# Patient Record
Sex: Male | Born: 1955 | Race: White | Hispanic: No | Marital: Married | Smoking: Never smoker
Health system: Southern US, Community
[De-identification: ages and names within clinical notes are randomized; demographics above are authoritative.]

## PROBLEM LIST (undated history)

## (undated) DIAGNOSIS — E7800 Pure hypercholesterolemia, unspecified: Secondary | ICD-10-CM

## (undated) DIAGNOSIS — K219 Gastro-esophageal reflux disease without esophagitis: Secondary | ICD-10-CM

## (undated) DIAGNOSIS — I8390 Asymptomatic varicose veins of unspecified lower extremity: Secondary | ICD-10-CM

## (undated) DIAGNOSIS — M1990 Unspecified osteoarthritis, unspecified site: Secondary | ICD-10-CM

## (undated) HISTORY — DX: Unspecified osteoarthritis, unspecified site: M19.90

## (undated) HISTORY — PX: CARDIAC CATHETERIZATION: SHX172

## (undated) HISTORY — PX: VARICOSE VEIN SURGERY: SHX832

## (undated) HISTORY — PX: KNEE SURGERY: SHX244

## (undated) HISTORY — DX: Gastro-esophageal reflux disease without esophagitis: K21.9

## (undated) HISTORY — DX: Asymptomatic varicose veins of unspecified lower extremity: I83.90

---

## 1991-03-05 HISTORY — PX: HERNIA REPAIR: SHX51

## 2003-11-29 ENCOUNTER — Ambulatory Visit (HOSPITAL_COMMUNITY): Admission: RE | Admit: 2003-11-29 | Discharge: 2003-11-29 | Payer: Self-pay | Admitting: Internal Medicine

## 2009-04-29 ENCOUNTER — Emergency Department (HOSPITAL_COMMUNITY): Admission: EM | Admit: 2009-04-29 | Discharge: 2009-04-29 | Payer: Self-pay | Admitting: Emergency Medicine

## 2009-09-13 ENCOUNTER — Ambulatory Visit: Payer: Self-pay | Admitting: Vascular Surgery

## 2009-11-28 ENCOUNTER — Ambulatory Visit: Payer: Self-pay | Admitting: Vascular Surgery

## 2010-01-23 ENCOUNTER — Ambulatory Visit: Payer: Self-pay | Admitting: Vascular Surgery

## 2010-03-19 ENCOUNTER — Ambulatory Visit
Admission: RE | Admit: 2010-03-19 | Discharge: 2010-03-19 | Payer: Self-pay | Source: Home / Self Care | Attending: Vascular Surgery | Admitting: Vascular Surgery

## 2010-03-28 ENCOUNTER — Ambulatory Visit
Admission: RE | Admit: 2010-03-28 | Discharge: 2010-03-28 | Payer: Self-pay | Source: Home / Self Care | Attending: Vascular Surgery | Admitting: Vascular Surgery

## 2010-03-28 ENCOUNTER — Ambulatory Visit
Admission: RE | Admit: 2010-03-28 | Payer: Self-pay | Source: Home / Self Care | Attending: Vascular Surgery | Admitting: Vascular Surgery

## 2010-03-29 NOTE — Assessment & Plan Note (Signed)
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  He has done well since his procedure. He has the usual amount of soreness.  The phlebectomy sites all look quite good.  He underwent duplex today in our office and this shows no evidence of acute DVT with successful ablation of his right small saphenous vein to just below the saphenopopliteal junction.  He will be seen on an as-needed basis.    Larina Earthly, M.D. Electronically Signed  TFE/MEDQ  D:  03/28/2010  T:  03/29/2010  Job:  [id number]  cc:   Quita Skye. Hart Rochester, M.D.

## 2010-04-06 NOTE — Procedures (Unsigned)
DUPLEX DEEP VENOUS EXAM - LOWER EXTREMITY  INDICATION:  Followup, status post right short saphenous vein.  HISTORY:  Edema:  No. Trauma/Surgery:  Yes, status post small right saphenous vein 1 week ago. Pain:  Yes. PE:  No. Previous DVT:  No. Anticoagulants:  No. Other:  DUPLEX EXAM:               CFV   SFV   PopV  PTV    GSV               R  L  R  L  R  L  R   L  R  L Thrombosis    o  o  o     o     o      + Spontaneous   +  +  +     +     +      o Phasic        +  +  +     +     +      o Augmentation  +  +  +     +     +      o Compressible  +  +  +     +     +      o Competent     +  +  +     +     +      o  Legend:  + - yes  o - no  p - partial  D - decreased  IMPRESSION:  No evidence of acute deep venous thrombosis.  Successful ablation of the right small saphenous vein from the proximal third calf to the saphenopopliteal junction.   _____________________________ Larina Earthly, M.D.  OD/MEDQ  D:  03/28/2010  T:  03/28/2010  Job:  [id number]

## 2010-04-12 ENCOUNTER — Ambulatory Visit (INDEPENDENT_AMBULATORY_CARE_PROVIDER_SITE_OTHER): Payer: BC Managed Care – PPO

## 2010-04-12 DIAGNOSIS — I83893 Varicose veins of bilateral lower extremities with other complications: Secondary | ICD-10-CM

## 2010-07-17 NOTE — Procedures (Signed)
LOWER EXTREMITY VENOUS REFLUX EXAM   INDICATION:  Painful varicose veins.   EXAM:  Using color-flow imaging and pulse Doppler spectral analysis, the  right common femoral, superficial femoral, popliteal, posterior tibial,  greater and lesser saphenous veins are evaluated.  There is evidence  suggesting deep venous insufficiency in the CFV, SFV, and popliteal  veins of the right lower extremity.   The right saphenofemoral junction is competent. The right GSV is  competent.   The right proximal short saphenous vein demonstrates incompetency with  measurements ranging from 0.33 at the origin to 0.25 cm in the distal  portion.   GSV Diameter (used if found to be incompetent only)                                            Right    Left  Proximal Greater Saphenous Vein           cm       cm  Proximal-to-mid-thigh                     cm       cm  Mid thigh                                 cm       cm  Mid-distal thigh                          cm       cm  Distal thigh                              cm       cm  Knee                                      cm       cm   IMPRESSION:  1. The right greater saphenous vein is competent.  2. The greater saphenous vein is not tortuous.  3. The deep venous system is not competent.  4. The right short saphenous vein is not competent with reflux of      >558milliseconds.      ___________________________________________  Quita Skye Hart Rochester, M.D.   LT/MEDQ  D:  01/23/2010  T:  01/23/2010  Job:  [id number]

## 2010-07-17 NOTE — Assessment & Plan Note (Signed)
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  He saw Dr. Arbie Cookey  2 months ago and does have evidence of recanalization of his right small  saphenous vein which was closed with the laser in 2007 by me.  He has  developed recurrent varicosities in the calf, which have become quite  painful and also severe swelling in the ankle which worsens as the day  progresses.  He has been wearing his long-leg elastic compression  stockings for the last few months as well as trying elevation as much as  his job will allow and taking ibuprofen but continues to have pain and  swelling.  His previous duplex exam performed in July revealed  recanalization of his right small saphenous vein with some areas of  partial occlusion and multiple varicosities extending from this.  He  also has an incompetent perforator in the distal calf communicating with  the deep system with reflux in the deep system.   I think that these symptoms are quite severe for this patient and  effecting his ability to work.  We will continue long-leg elastic  compression stockings for another 6 weeks as well as elevation and  ibuprofen.  He will return at that time with a repeat duplex exam of his  right leg.  We will once again look at the small saphenous veins and  consider repeat closure of the right small saphenous vein with laser as  well as multiple stab phlebectomies to hopefully relieve his symptoms.     Quita Skye Hart Rochester, M.D.  Electronically Signed   JDL/MEDQ  D:  11/28/2009  T:  11/28/2009  Job:  [id number]

## 2010-07-17 NOTE — Procedures (Signed)
LOWER EXTREMITY VENOUS REFLUX EXAM   INDICATION:  Right ropey varicose veins.   EXAM:  Using color-flow imaging and pulse Doppler spectral analysis, the  right common femoral, superficial femoral, popliteal, posterior tibial,  greater and lesser saphenous veins are evaluated.  There is no evidence  suggesting deep venous insufficiency in the right lower extremity.   The right saphenofemoral junction is competent. The right GSV is  competent.   The right proximal short saphenous vein demonstrates incompetency.  The  right lesser saphenous vein appears with thrombus and reflux noted.   GSV Diameter (used if found to be incompetent only)                                            Right    Left  Proximal Greater Saphenous Vein           cm       cm  Proximal-to-mid-thigh                     cm       cm  Mid thigh                                 cm       cm  Mid-distal thigh                          cm       cm  Distal thigh                              cm       cm  Knee                                      cm       cm   IMPRESSION:  1. The right greater saphenous vein appears with no reflux.  2. The right greater saphenous vein is not aneurysmal.  3. The right greater saphenous vein is not tortuous.  4. The deep venous system is not competent with Reflux of      >575milliseconds in the right leg.  5. The right lesser saphenous vein is not competent with reflux of      >500 milliseconds.  6. The right lesser saphenous vein appears partially compressible with      multiple varicosities in that area.    ___________________________________________  Larina Earthly, M.D.   CB/MEDQ  D:  09/13/2009  T:  09/13/2009  Job:  [id number]

## 2010-07-17 NOTE — Assessment & Plan Note (Signed)
OFFICE VISIT   Geoffrey [name], Geoffrey [name]  DOB:  Jun 04, 1955                                       01/23/2010  [id number]#:[id number]   The patient returns today having tried elastic compression stockings for  the past 3 months to treat his venous hypertension in the left leg  secondary to recanalization of his left small saphenous vein with severe  reflux.  He has been having severe swelling in the left leg below the  knee, particularly in the ankle, with a very hypersensitive calf and  tender varicosities.  He had an episode of thrombophlebitis several  months ago and has had no episodes of deep vein thrombosis.  He did have  successful closure of the left small saphenous vein in 2007 by me, and  these recurrent varicosities occurred recently.   On examination, he does have very tense varicosities in the left calf  which are tender and 1 to 2-plus edema distally with an extensive  pattern of telangiectasias in the ankle.  He has 1 very thin the varix  in the lower third of the leg medially, which appears to be at risk of  rupture.   Today I repeated a venous duplex exam in the office, which reveals  complete recanalization of the left small saphenous vein with high  pressure filling these tender varicosities.  There is also an  incompetent perforator distally.   I think he should proceed with laser ablation of the left small  saphenous vein with multiple stab phlebectomies as his first procedure  and #2, sclerotherapy of this very superficial varix in the calf which  has a potential for rupture.  We will proceed with precertification to  perform this in the near future to treat these symptoms, which are  clearly affecting his daily living.     Quita Skye Hart Rochester, M.D.  Electronically Signed   JDL/MEDQ  D:  01/23/2010  T:  01/24/2010  Job:  [id number]

## 2010-07-17 NOTE — Assessment & Plan Note (Signed)
OFFICE VISIT   Geoffrey [name], Geoffrey [name]  DOB:  02/24/1956                                       03/19/2010  GMWNU#:[id number]   The patient had laser ablation of the right small saphenous vein from  the proximal third of the calf to the saphenopopliteal junction plus 10-  20 stab phlebectomies of painful varicosities in the right posterior  calf area.  He tolerated the procedure well.  He will return in 1 week  for venous duplex exam to confirm closure of the right small saphenous  vein.  He had previous closure in April 2007 with initial successful  closure and then later a partial recanalization.  I was able to traverse  the areas of partial thrombosis up to near the saphenopopliteal junction  and hopefully this will completely close the vein on this occasion.     Quita Skye Hart Rochester, M.D.  Electronically Signed   JDL/MEDQ  D:  03/19/2010  T:  03/20/2010  Job:  [id number]

## 2010-07-17 NOTE — Assessment & Plan Note (Signed)
OFFICE VISIT   [name] [name] GELPI  DOB:  11-16-1955                                       09/13/2009  UEAVW#:[id number]   The patient presents today for evaluation of pain in his right posterior  calf.  He is well-known to our practice from a prior right small  saphenous vein laser ablation by Dr. Hart Rochester in 2007.  He reports that  over the past 2 months he has had recurrent pain in his posterior calf  and was diagnosed as recurrent superficial thrombophlebitis.  He had a  venous duplex at [facility] in May which I have reviewed and was  a rule out DVT study which was negative.  He continues to have  difficulty with pain in his posterior calf and tenderness over this area  as well.  He has felt sensation of hardness in this area since this  event.  He does still continue to have significant tenderness although  not as severe as his initial episode.   On physical examination he does have tributary varicosities in his  posterior calf and a great deal of spider vein telangiectasia below this  onto his ankle and foot.  He does have severe tenderness over this  posterior aspect of his calf.  There is no fluctuance and no evidence of  infection.  He underwent repeat venous duplex in our office and again  this confirmed no evidence of DVT.  He does have some thrombus in the  small saphenous vein with some clot in his tributary varicosities as  well.  I explained to the patient that this does not put him at any  significantly increased risk for DVT or more serious complications.  We  would recommend continued elevation and ibuprofen.  He is having a very  difficult time wearing compression garments due to discomfort.  He will  return in 2 months to see Dr. Hart Rochester at which time hopefully the  tenderness will be resolved to the degree he can have planning for  either continued observation or treatment of these tributary  varicosities in his posterior calf.     Larina Earthly, M.D.  Electronically Signed   TFE/MEDQ  D:  09/13/2009  T:  09/14/2009  Job:  [id number]   cc:   Doreen Beam, MD

## 2010-07-20 NOTE — Cardiovascular Report (Signed)
NAME:  Geoffrey [name], Geoffrey [name] NO.:  [id number]   MEDICAL RECORD NO.:  [id number]          PATIENT TYPE:  OIB   LOCATION:  2899                         FACILITY:  [facility]   PHYSICIAN:  Carole Binning, M.D. LHCDATE OF BIRTH:  30-Dec-1955   DATE OF PROCEDURE:  11/29/2003  DATE OF DISCHARGE:                              CARDIAC CATHETERIZATION   PROCEDURE PERFORMED:  Left heart catheterization with coronary angiography  and left ventriculography.   INDICATION:  [name] is a 55 year old male who has had symptoms of  progressive exertional dyspnea and chest pain.  He recently underwent a  stress Cardiolite scan which showed possible anterior apical ischemia.  He  was thus referred for right and left heart catheterization to assess his  hemodynamics and coronary anatomy.   CATHETERIZATION PROCEDURAL NOTE:  We initially placed a 6 French sheath in  the right femoral artery.  Coronary angiography was performed with standard  Judkins 6 French catheters.  After we found the patient had normal coronary  arteries we opted to proceed with right heart catheterization as well.  We  placed a 7 French sheath in the right femoral vein.  Right heart  catheterization was performed with a Swan-Ganz catheter.  Left  ventriculography was performed with an angled pigtail catheter.  Contrast  was Omnipaque.  There were no complications.   CATHETERIZATION RESULTS:   HEMODYNAMICS:  Right atrium mean pressure 7, right ventricular pressure  27/8, pulmonary artery pressure 29/14, pulmonary capillary wedge mean  pressure 12, left ventricular pressure 98/14, aortic pressure 104/74.  There  is no significant aortic valve gradient.  There is not a significant  gradient between the pulmonary capillary wedge pressure and left ventricular  and end-diastolic pressure.   Cardiac output by the thermodilution method is 5.5.  Cardiac index 2.7.  Cardiac output by the Fick method is 4.8.  Cardiac index  2.4.   LEFT VENTRICULOGRAM:  Wall motion is normal. Ejection fraction is calculated  at 57%.  There is no mitral regurgitation.   CORONARY ARTERIOGRAPHY (RIGHT DOMINANT):  Left main is normal.   Left anterior descending artery gives rise to a single large bifurcating  diagonal branch.  The LAD is normal.   Left circumflex gives rise to a small ramus intermedius, small first obtuse  marginal and large second obtuse marginal and small third obtuse marginal.  The left circumflex is normal.   Right coronary artery gives rise to a normal size posterior descending  artery and a small posterior lateral branch.  The right coronary artery is  normal.   IMPRESSION:  1.  Normal right and left heart pressures with normal pulmonary artery      pressures.  2.  Normal left ventricular systolic function.  3.  Normal coronary arteries.   RECOMMENDATIONS:  Continued medical therapy with evaluation for alternative  etiologies of the patient's symptoms.       MWP/MEDQ  D:  11/29/2003  T:  11/29/2003  Job:  [id number]   cc:   Dr.  Weyman Pedro

## 2011-10-15 ENCOUNTER — Ambulatory Visit (HOSPITAL_COMMUNITY)
Admission: RE | Admit: 2011-10-15 | Discharge: 2011-10-15 | Disposition: A | Discharge status: Home / Self Care | Payer: BC Managed Care – PPO | Source: Ambulatory Visit | Attending: Specialist | Admitting: Specialist

## 2011-10-15 DIAGNOSIS — R262 Difficulty in walking, not elsewhere classified: Secondary | ICD-10-CM | POA: Insufficient documentation

## 2011-10-15 DIAGNOSIS — IMO0001 Reserved for inherently not codable concepts without codable children: Secondary | ICD-10-CM | POA: Insufficient documentation

## 2011-10-15 DIAGNOSIS — M25669 Stiffness of unspecified knee, not elsewhere classified: Secondary | ICD-10-CM | POA: Insufficient documentation

## 2011-10-15 DIAGNOSIS — M25569 Pain in unspecified knee: Secondary | ICD-10-CM | POA: Insufficient documentation

## 2011-10-15 DIAGNOSIS — M25562 Pain in left knee: Secondary | ICD-10-CM | POA: Insufficient documentation

## 2011-10-15 NOTE — Evaluation (Signed)
Physical Therapy Evaluation  Patient Details  Name: Geoffrey [name] MRN: [id number] Date of Birth: 08/06/1955  Today's Date: 10/15/2011 Time: 1015-1104 PT Time Calculation (min): 49 min  Visit#: 1  of 8   Re-eval: 11/14/11 Assessment Diagnosis: s/p arthroscopic surgery Surgical Date: 10/08/11 Next MD Visit: 10/30/11 Prior Therapy: none  Authorization: [employer]  Authorization Time Period:    Authorization Visit#: 1  of 8    Past Medical History: No past medical history on file. Past Surgical History: No past surgical history on file.  Subjective Symptoms/Limitations Symptoms: Mr. Virgin states that he had arthroscopic surgery done to his right knee on 10/08/11 after his knee had been bothering him for three months.  He states that his pain is better but he his knee is still giving way on him.  He states he is only able to be up for about fifteen minutes.  He has currently been referred for OP rehab.  How long can you sit comfortably?: The patient states that he is able to sit for about 30 minutes and then his knee gets stiff and he needs to get up. How long can you stand comfortably?: The patient states that standing is more painful. He is only able to stand for 10-15 minutes. How long can you walk comfortably?: He is able able to walk for about 15 minutes. Special Tests: He is having difficulty getting to sleep;  He is waking 3-4 times a night. Patient Stated Goals: Knee to not give out on him. Pain Assessment Currently in Pain?: Yes Pain Score:   4 (worst pain 8/10; best 3/10) Pain Location: Knee Pain Orientation: Right;Medial;Posterior Pain Type: Surgical pain Pain Onset: In the past 7 days Pain Frequency: Constant Pain Relieving Factors: ice/ meds. Multiple Pain Sites: Yes   Prior Functioning  Home Living Lives With: Family Type of Home: House Home Access: Stairs to enter [employer] of Steps:  (3 able to do but not reciprocally) Entrance Stairs-Rails:  Right Prior Function Vocation: Full time employment Vocation Requirements: on feet 12 hours; bend on an occasional basis Leisure: Hobbies-yes (Comment) Comments: fishing;   Cognition/Observation Cognition Overall Cognitive Status: Appears within functional limits for tasks assessed Observation/Other Assessments Observations: pt ambulating with an antalgic gait.  Sensation/Coordination/Flexibility/Functional Tests Functional Tests Functional Tests: LEFS 15/80  Assessment RLE AROM (degrees) Right Knee Extension: 5  Right Knee Flexion: 100  RLE Strength Right Hip Flexion: 2/5 Right Hip Extension: 3-/5 Right Hip ABduction: 5/5 Right Hip ADduction: 3-/5 Right Knee Flexion: 3-/5 Right Knee Extension: 3/5 Right Ankle Dorsiflexion: 4/5  Exercise/Treatments   Seated Long Arc Quad: 10 reps Supine Quad Sets: 10 reps Heel Slides: 10 reps Terminal Knee Extension: 10 reps Hip Adduction Isometric: 10 reps   Prone  Hamstring Curl: 10 reps Hip Extension: 10 reps    Physical Therapy Assessment and Plan PT Assessment and Plan Clinical Impression Statement: Pt s/p arthroscopic surgery who will benefit from skilled physcial therapy to improve strength, ROM and decrease pain to return pt to prior functional level. Pt will benefit from skilled therapeutic intervention in order to improve on the following deficits: Increased edema;Pain;Decreased strength;Difficulty walking;Increased muscle spasms;Decreased range of motion Rehab Potential: Good PT Frequency: Min 2X/week PT Duration: 4 weeks PT Treatment/Interventions: Gait training;Therapeutic activities;Therapeutic exercise;Modalities;Manual techniques PT Plan: Next treatment add bike; heel raise, minisquat, standing terminal extension and rockerboard; third treatment begin lateral and forward step up attempt SLR(unable to complete w/ good quad set at eval), and SL adduction    Goals  Home Exercise Program Pt will Perform Home  Exercise Program: Independently PT Short Term Goals Time to Complete Short Term Goals: 2 weeks PT Short Term Goal 1: Pt ROM to be 5 to 110 to allow comfort of sitting for 45 min. PT Short Term Goal 2: Pt strength to improve 1/2 grade to allow comfort of walking  x  1 hour. PT Short Term Goal 3: Pain to be decreased by 3 levels. PT Long Term Goals Time to Complete Long Term Goals: 4 weeks PT Long Term Goal 1: ROM to be 0 to 130 to allow comfort of sitting for an hour and a half for ease of travel and watching a movie PT Long Term Goal 2: strength to be increased one grade to allow pt to be up on his feet for 3 hours at a time without difficulty Long Term Goal 3: Pt pain to be no higher than a 3 80% of the day. Long Term Goal 4: LEFS to increase by 20 points. PT Long Term Goal 5: Pt to be ambulating with a normalized gt.  Problem List Patient Active Problem List  Diagnosis  . Difficulty in walking  . Stiffness of joint, not elsewhere classified, lower leg  . Pain in left knee    PT - End of Session Activity Tolerance: Patient tolerated treatment well General Behavior During Session: [facility] for tasks performed Cognition: [facility] for tasks performed PT Plan of Care PT Home Exercise Plan: given PT Patient Instructions: Ex and ice 3 x/day Consulted and Agree with Plan of Care: Patient  GP    Sameera Betton,CINDY 10/15/2011, 11:22 AM  Physician Documentation Your signature is required to indicate approval of the treatment plan as stated above.  Please sign and either send electronically or make a copy of this report for your files and return this physician signed original.   Please mark one 1.__approve of plan  2. ___approve of plan with the following conditions.   ______________________________                                                          _____________________ Physician Signature                                                                                                              Date

## 2011-10-17 ENCOUNTER — Ambulatory Visit (HOSPITAL_COMMUNITY)
Admission: RE | Admit: 2011-10-17 | Discharge: 2011-10-17 | Disposition: A | Discharge status: Home / Self Care | Payer: BC Managed Care – PPO | Source: Ambulatory Visit | Attending: Specialist | Admitting: Specialist

## 2011-10-17 NOTE — Progress Notes (Signed)
Physical Therapy Treatment Patient Details  Name: Geoffrey [name] MRN: [id number] Date of Birth: 02/23/1956  Today's Date: 10/17/2011 Time: 8295-6213 PT Time Calculation (min): 47 min  Visit#: 2  of 8   Re-eval: 11/14/11 Charges: Therex x 28' Manual x 12'  Authorization: [employer]  Authorization Visit#: 2  of 8    Subjective: Symptoms/Limitations Symptoms: Pt states that he is having a lot of pain today. He reports he is icing 3x a day. Pain Assessment Currently in Pain?: Yes Pain Score:   7 Pain Location: Knee Pain Orientation: Right;Medial   Exercise/Treatments Aerobic Stationary Bike: 6'@2 .0 seat 12 Standing Heel Raises: 10 reps;Limitations Heel Raises Limitations: Tor raises x 10 Functional Squat: 10 reps Rocker Board: 2 minutes;Limitations Rocker Board Limitations: A/P R/L Seated Long Arc Quad: 10 reps Supine Quad Sets: 10 reps Short Arc Quad Sets: 10 reps Hip Adduction Isometric: 20 reps Prone  Hamstring Curl: 15 reps Hip Extension: 10 reps   Manual Therapy Manual Therapy: Massage Massage: STM to R medial knee ot decrease sensitivity and pain.  Physical Therapy Assessment and Plan PT Assessment and Plan Clinical Impression Statement: Pt completes therex well with minimal need for cueing. Pt is hyper sensitive to touch in medial R knee. Manual techniques completed to decrease pain and senility. Pt reports pain decrease to 3/10 at end of session. PT Plan: Continue per PT POC. Begin lateral and forward step ups next session.     Problem List Patient Active Problem List  Diagnosis  . Difficulty in walking  . Stiffness of joint, not elsewhere classified, lower leg  . Pain in left knee    PT - End of Session Activity Tolerance: Patient tolerated treatment well General Behavior During Session: [facility] for tasks performed Cognition: [facility] for tasks performed PT Plan of Care Consulted and Agree with Plan of Care: Patient  Seth Bake, PTA 10/17/2011, 3:22  PM

## 2011-10-22 ENCOUNTER — Ambulatory Visit (HOSPITAL_COMMUNITY)
Admission: RE | Admit: 2011-10-22 | Discharge: 2011-10-22 | Disposition: A | Discharge status: Home / Self Care | Payer: BC Managed Care – PPO | Source: Ambulatory Visit | Attending: Specialist | Admitting: Specialist

## 2011-10-22 NOTE — Progress Notes (Signed)
Physical Therapy Treatment Patient Details  Name: Geoffrey [name] MRN: [id number] Date of Birth: Apr 02, 1955  Today's Date: 10/22/2011 Time: 4098-1191 PT Time Calculation (min): 44 min Visit#: 3  of 8   Re-eval: 11/14/11 Charges:  therex 38'    Subjective: Symptoms/Limitations Symptoms: Pt. states he is in the same amount of pain today as has been.; only when weight bearing. Pain Assessment Currently in Pain?: Yes Pain Score:   6 Pain Location: Knee Pain Orientation: Right;Medial   Exercise/Treatments Aerobic Stationary Bike: 6'@3 .0 seat 12 Standing Heel Raises: 15 reps Heel Raises Limitations: Toe raises x 10 Lateral Step Up: 10 reps;Step Height: 4";Hand Hold: 2 Forward Step Up: 10 reps;Step Height: 4";Hand Hold: 1 Functional Squat: 10 reps Rocker Board: 2 minutes;Limitations Rocker Board Limitations: A/P R/L Seated Long Arc Quad: 15 reps Supine Quad Sets: 15 reps Short Arc [employer] Sets: 10 reps;Limitations Short Arc Quad Sets Limitations: 5" holds Heel Slides: 10 reps Hip Adduction Isometric: 20 reps Prone  Hamstring Curl: 15 reps Hip Extension: 15 reps   Manual Therapy Manual Therapy: Massage Massage: Prone:  STM to R posterior/medial knee to decrease sensitivity and pain  Physical Therapy Assessment and Plan PT Assessment and Plan Clinical Impression Statement: Added forward and lateral step ups with help of UE's to decrease pain.  More pain felt with forward vs. lateral step ups.  Pt. very sensitive to STM with difficulty relaxing due to fear of pain.  Pt. may benefit from different modality rather than manual technique (estim?) to help decrease pain and sensitivity. PT Plan: Continue to progress therex and work toward goals.     Problem List Patient Active Problem List  Diagnosis  . Difficulty in walking  . Stiffness of joint, not elsewhere classified, lower leg  . Pain in left knee    PT - End of Session Activity Tolerance: Patient tolerated treatment  well General Behavior During Session: [facility] for tasks performed Cognition: [facility] for tasks performed   Lurena Nida, PTA/CLT 10/22/2011, 2:35 PM

## 2011-10-24 ENCOUNTER — Ambulatory Visit (HOSPITAL_COMMUNITY)
Admission: RE | Admit: 2011-10-24 | Discharge: 2011-10-24 | Disposition: A | Discharge status: Home / Self Care | Payer: BC Managed Care – PPO | Source: Ambulatory Visit | Attending: Internal Medicine | Admitting: Internal Medicine

## 2011-10-24 NOTE — Progress Notes (Signed)
Physical Therapy Treatment Patient Details  Name: Geoffrey [name] MRN: [id number] Date of Birth: 1955/04/05  Today's Date: 10/24/2011 Time: 8295-6213 PT Time Calculation (min): 52 min  Visit#: 4  of 8   Re-eval: 11/14/11 Charges: Therex x 34' Manual x 12'   Subjective: Symptoms/Limitations Symptoms: Pt states he continues to have pain and tightness in VMO and hamstings. Pain Assessment Currently in Pain?: Yes Pain Score:   4 Pain Location: Knee Pain Orientation: Right;Medial   Exercise/Treatments Stretches Passive Hamstring Stretch: 3 reps;30 seconds;Limitations Passive Hamstring Stretch Limitations: with rope Gastroc Stretch: 1 rep;30 seconds;Limitations Gastroc Stretch Limitations: Shown for HEP Standing Heel Raises: 20 reps Heel Raises Limitations: Toe raises x 15 Lateral Step Up: 15 reps;Right;Step Height: 4";Hand Hold: 2 Forward Step Up: 15 reps;Step Height: 4";Hand Hold: 1;Right Step Down: 10 reps;Right;Hand Hold: 1;Step Height: 4" Functional Squat: 15 reps Rocker Board: 2 minutes;Limitations Rocker Board Limitations: A/P R/L Seated Long Arc Quad: 20 reps Supine Short Arc Quad Sets: 15 reps Short Arc Quad Sets Limitations: 5" holds  Physical Therapy Assessment and Plan PT Assessment and Plan Clinical Impression Statement: Pt presents with improved tolerance for manual. Pt is most sensitive in distal VMO. Pt appears to benefit from manual as pt reports pain decrease to 1/10 at end of session. Pt completes therex well but does require multimodal cueing for proper form with functional squats. PT Plan: Continue to progress therex and work toward goals per PT POC.     Problem List Patient Active Problem List  Diagnosis  . Difficulty in walking  . Stiffness of joint, not elsewhere classified, lower leg  . Pain in left knee    PT - End of Session Activity Tolerance: Patient tolerated treatment well General Behavior During Session: [facility] for tasks  performed Cognition: [facility] for tasks performed  Seth Bake, PTA 10/24/2011, 2:08 PM

## 2011-10-29 ENCOUNTER — Ambulatory Visit (HOSPITAL_COMMUNITY)
Admission: RE | Admit: 2011-10-29 | Discharge: 2011-10-29 | Disposition: A | Discharge status: Home / Self Care | Payer: BC Managed Care – PPO | Source: Ambulatory Visit | Attending: Specialist | Admitting: Specialist

## 2011-10-29 NOTE — Progress Notes (Signed)
Physical Therapy Treatment Patient Details  Name: Geoffrey [name] MRN: [id number] Date of Birth: 1955-03-07  Today's Date: 10/29/2011 Time: 1102-1204 PT Time Calculation (min): 62 min  Visit#: 5  of 8   Re-eval: 11/14/11 Charges: Therex x 30' Ice w/estim x 15' Manual x 8'   Subjective: Symptoms/Limitations Symptoms: Pt that he is still having sensitivity in R medial knee. Pain Assessment Currently in Pain?: Yes Pain Score:   5 Pain Location: Knee Pain Orientation: Right;Medial   Exercise/Treatments Standing Lateral Step Up: 15 reps;Right;Step Height: 4";Hand Hold: 2 Forward Step Up: 15 reps;Right;Step Height: 6";Hand Hold: 1 Step Down: 15 reps;Step Height: 4";Hand Hold: 1;Right Functional Squat: 15 reps Rocker Board: 2 minutes;Limitations Rocker Board Limitations: A/P R/L Other Standing Knee Exercises: Heel/toe walk 1 RT   Modalities Modalities: Electrical Stimulation;Cryotherapy Manual Therapy Manual Therapy: Joint mobilization Joint Mobilization: R knee grade II-III joint mobs to imrpove flexion/ext Cryotherapy Number Minutes Cryotherapy: 15 Minutes Cryotherapy Location: Knee (Right) Type of Cryotherapy: Ice pack Pharmacologist Location: R medial knee Electrical Stimulation Action: IFES Electrical Stimulation Parameters: 9.6 votls x 15' Electrical Stimulation Goals: Pain  Physical Therapy Assessment and Plan PT Assessment and Plan Clinical Impression Statement: Pt completes therex with minimal difficulty. Pt requires multimodal cueing to avoid dropping L hip to compensate for eccentric R quad weakened with lateral step ups. Began estim to medial knee to decrease pain and sensitivity. Pt reports pain decrease to 2/10 at end of session. PT Plan: Continue to progress therex and work toward goals per PT POC.     Problem List Patient Active Problem List  Diagnosis  . Difficulty in walking  . Stiffness of joint, not elsewhere  classified, lower leg  . Pain in left knee    PT - End of Session Activity Tolerance: Patient tolerated treatment well General Behavior During Session: [facility] for tasks performed Cognition: [facility] for tasks performed  Seth Bake, PTA 10/29/2011, 1:15 PM

## 2011-10-31 ENCOUNTER — Ambulatory Visit (HOSPITAL_COMMUNITY)
Admission: RE | Admit: 2011-10-31 | Discharge: 2011-10-31 | Disposition: A | Discharge status: Home / Self Care | Payer: BC Managed Care – PPO | Source: Ambulatory Visit | Attending: Internal Medicine | Admitting: Internal Medicine

## 2011-10-31 NOTE — Progress Notes (Signed)
Physical Therapy Treatment Patient Details  Name: Geoffrey [name] MRN: [id number] Date of Birth: Jun 28, 1955  Today's Date: 10/31/2011 Time: 1020-1056 PT Time Calculation (min): 36 min Visit#: 6  of 8   Re-eval: 11/14/11 Charges:  therex 35'     Subjective: Symptoms/Limitations Symptoms: Pt. states he has minimal pain, 2/10 today.  Reports he returned to MD and he is going back to work on Tuesday.   Exercise/Treatments Standing Heel Raises: 20 reps Heel Raises Limitations: Toe raises x 15 Lateral Step Up: 15 reps;Right;Step Height: 4";Hand Hold: 2 Forward Step Up: 15 reps;Right;Step Height: 6";Hand Hold: 1 Step Down: 15 reps;Step Height: 4";Hand Hold: 1;Right Functional Squat: 15 reps Stairs: 2 RT reciprocal Rocker Board: 2 minutes;Limitations Rocker Board Limitations: A/P R/L SLS with Vectors: 5X5" holds with R LE Other Standing Knee Exercises: Heel/toe walk 2 RT    Physical Therapy Assessment and Plan PT Assessment and Plan Clinical Impression Statement: Added vector stance and worked on negotiating stairs reciprocally.  Pt. able to ascend reciprocally, however had diffiuculty descending.  Pt. declined estim and/or ice today.  PT Plan: Continue 2 more visits then re-eval.     Problem List Patient Active Problem List  Diagnosis  . Difficulty in walking  . Stiffness of joint, not elsewhere classified, lower leg  . Pain in left knee    PT - End of Session Activity Tolerance: Patient tolerated treatment well General Behavior During Session: [facility] for tasks performed Cognition: [facility] for tasks performed   Lurena Nida, PTA/CLT 10/31/2011, 11:06 AM

## 2012-05-09 ENCOUNTER — Encounter (HOSPITAL_COMMUNITY): Payer: Self-pay | Admitting: *Deleted

## 2012-05-09 ENCOUNTER — Emergency Department (HOSPITAL_COMMUNITY)
Admission: EM | Admit: 2012-05-09 | Discharge: 2012-05-09 | Disposition: A | Discharge status: Home / Self Care | Payer: Worker's Compensation | Attending: Emergency Medicine | Admitting: Emergency Medicine

## 2012-05-09 ENCOUNTER — Emergency Department (HOSPITAL_COMMUNITY): Payer: Worker's Compensation

## 2012-05-09 DIAGNOSIS — S298XXA Other specified injuries of thorax, initial encounter: Secondary | ICD-10-CM | POA: Insufficient documentation

## 2012-05-09 DIAGNOSIS — W010XXA Fall on same level from slipping, tripping and stumbling without subsequent striking against object, initial encounter: Secondary | ICD-10-CM | POA: Insufficient documentation

## 2012-05-09 DIAGNOSIS — Y9329 Activity, other involving ice and snow: Secondary | ICD-10-CM | POA: Insufficient documentation

## 2012-05-09 DIAGNOSIS — Z79899 Other long term (current) drug therapy: Secondary | ICD-10-CM | POA: Insufficient documentation

## 2012-05-09 DIAGNOSIS — Y929 Unspecified place or not applicable: Secondary | ICD-10-CM | POA: Insufficient documentation

## 2012-05-09 DIAGNOSIS — W19XXXA Unspecified fall, initial encounter: Secondary | ICD-10-CM

## 2012-05-09 DIAGNOSIS — R0781 Pleurodynia: Secondary | ICD-10-CM

## 2012-05-09 MED ORDER — TRAMADOL HCL 50 MG PO TABS: 50.0000 mg | ORAL_TABLET | Freq: Four times a day (QID) | ORAL | Status: DC | PRN

## 2012-05-09 MED ORDER — CYCLOBENZAPRINE HCL 10 MG PO TABS: 10.0000 mg | ORAL_TABLET | Freq: Three times a day (TID) | ORAL | Status: DC | PRN

## 2012-05-09 NOTE — ED Notes (Signed)
Pt presents to er after slipping on ice this am, pt states that he landed "face first" causing his left elbow to land up against his left ribs, pt c/o pain to left rib area, worse with movement,

## 2012-05-10 NOTE — ED Provider Notes (Signed)
History     CSN: [id number]  Arrival date & time 05/09/12  1002   First MD Initiated Contact with Patient 05/09/12 1045      Chief Complaint  Patient presents with  . Rib Injury    (Consider location/radiation/quality/duration/timing/severity/associated sxs/prior treatment) HPI Comments: Patient c/o sudden onset of sharp pain to his left antero lateral chest wall.  States that he slipped on the ice and fell earlier of the morning of ED arrival.   Pain to his left chest is worse with movement and deep breathing.  He denies shortness of breath, abd pain, neck pain , head injury or back pain.  Pain to the chest wall improves with sitting and remaining still.  He has not tried anything for the pain  The history is provided by the patient.    History reviewed. No pertinent past medical history.  Past Surgical History  Procedure Laterality Date  . Knee surgery      No family history on file.  History  Substance Use Topics  . Smoking status: Never Smoker   . Smokeless tobacco: Not on file  . Alcohol Use: Yes     Comment: occassional      Review of Systems  Constitutional: Negative for fever, activity change and appetite change.  HENT: Negative for neck pain.   Respiratory: Negative for chest tightness, shortness of breath, wheezing and stridor.   Cardiovascular: Positive for chest pain. Negative for palpitations and leg swelling.  Gastrointestinal: Negative for nausea, vomiting, abdominal pain and abdominal distention.  Genitourinary: Negative for flank pain.  Musculoskeletal: Negative for back pain, joint swelling and gait problem.  Skin: Negative for color change and wound.  Neurological: Negative for dizziness, syncope, weakness, light-headedness, numbness and headaches.  All other systems reviewed and are negative.    Allergies  Review of patient's allergies indicates no known allergies.  Home Medications   Current Outpatient Rx  Name  Route  Sig  Dispense   Refill  . ALPRAZolam (XANAX) 0.5 MG tablet   Oral   Take 0.5 mg by mouth 3 (three) times daily as needed for sleep or anxiety.         Marland Kitchen azithromycin (ZITHROMAX) 250 MG tablet   Oral   Take 250-500 mg by mouth daily. Take 500mg  on day 1 then 250mg  daily for 4 days. Started 05/06/12         . CYANOCOBALAMIN PO   Oral   Take 1 tablet by mouth daily. Unknown strength         . diclofenac (FLECTOR) 1.3 % PTCH   Transdermal   Place 1 patch onto the skin daily as needed.         Marland Kitchen esomeprazole (NEXIUM) 20 MG capsule   Oral   Take 20 mg by mouth daily.         . fish oil-omega-3 fatty acids 1000 MG capsule   Oral   Take 2 g by mouth daily.         . Glucosamine-Chondroitin (GLUCOSAMINE CHONDR COMPLEX PO)   Oral   Take 1 tablet by mouth daily. Unknown strength         . HYDROcodone-homatropine (HYCODAN) 5-1.5 MG/5ML syrup   Oral   Take 10 mLs by mouth every 6 (six) hours as needed for cough.         . lidocaine (LIDODERM) 5 %   Transdermal   Place 1 patch onto the skin daily. Remove & Discard patch within 12 hours or  as directed by MD         . Multiple Vitamin (MULTIVITAMIN WITH MINERALS) TABS   Oral   Take 1 tablet by mouth daily.         . Pyridoxine HCl (B-6 PO)   Oral   Take 1 tablet by mouth daily. Unknown strength         . cyclobenzaprine (FLEXERIL) 10 MG tablet   Oral   Take 1 tablet (10 mg total) by mouth 3 (three) times daily as needed for muscle spasms.   21 tablet   0   . traMADol (ULTRAM) 50 MG tablet   Oral   Take 1 tablet (50 mg total) by mouth every 6 (six) hours as needed for pain.   15 tablet   0     BP 125/84  Pulse 77  Temp(Src) 98.1 F (36.7 C) (Oral)  Resp 20  SpO2 95%  Physical Exam  Nursing note and vitals reviewed. Constitutional: He is oriented to person, place, and time. He appears well-developed and well-nourished. No distress.  HENT:  Head: Normocephalic and atraumatic.  Eyes: Conjunctivae are normal.  Pupils are equal, round, and reactive to light.  Neck: Normal range of motion. Neck supple.  Cardiovascular: Normal rate, regular rhythm, normal heart sounds and intact distal pulses.   No murmur heard. Pulmonary/Chest: Effort normal and breath sounds normal. He exhibits tenderness and bony tenderness. He exhibits no mass, no laceration, no crepitus, no edema, no deformity, no swelling and no retraction.    Localized ttp along the lower portion of the left anterior and lateral ribs.  No bony deformity, edema, bruising or crepitus  Abdominal: Soft. He exhibits no distension and no mass. There is no tenderness. There is no rebound and no guarding.  Musculoskeletal: Normal range of motion.  Lymphadenopathy:    He has no cervical adenopathy.  Neurological: He is alert and oriented to person, place, and time. He exhibits normal muscle tone. Coordination normal.  Skin: Skin is warm and dry.    ED Course  Procedures (including critical care time)  Labs Reviewed - No data to display Dg Chest 2 View  05/09/2012  *RADIOLOGY REPORT*  Clinical Data: Post fall, now with left lateral rib pain  CHEST - 2 VIEW  Comparison: Left rib radiographic series - earlier same day  Findings: Grossly normal cardiac silhouette and mediastinal contours.  There is mild elevation of the left hemidiaphragm with marked gaseous distension of the stomach which has an air-fluid level within it.  Minimal bilateral peri and infrahilar heterogeneous opacities.  No focal airspace opacity.  No definite pleural effusion or pneumothorax.  No acute osseous abnormalities.  IMPRESSION:  1.  No acute cardiopulmonary disease. 2.  Decreased lung volumes with peri and infrahilar heterogeneous opacities favored to represent atelectasis. 3.  Elevation of the left hemidiaphragm and marked gaseous distension of the stomach.   Original Report Authenticated By: Tacey Ruiz, MD    Dg Ribs Unilateral W/chest Left  05/09/2012  *RADIOLOGY REPORT*   Clinical Data: Post fall, now with lateral rib pain  LEFT RIBS AND CHEST - 3+ VIEW  Comparison: Chest radiograph - earlier same day  Findings:  No acute osseous abnormalities.  Specifically, no displaced left- sided rib fractures.  Marked gaseous distension of the stomach which contains an air- fluid level.  IMPRESSION: No displaced left-sided rib fractures.   Original Report Authenticated By: Tacey Ruiz, MD      1. Rib pain on left  side   2. Fall, initial encounter       MDM    Patient has localized ttp of the left antero lateral ribs secondary to a fall.  No crepitus, abrasion, bruising or soft tissue emphysema on exam.  Abdomen is soft and NT on exam.  No tachycardia, tachypnea, lung sounds are CTA bilaterally.    Incentive spirometer ordered and pt instructed on use, will prescribe flexeril and ultram for pain.  Pt currently taking narcotic cough syrup and antibiotic for cough as prescribed by his PMD.  He agrees to return here if the sx's worsen , otherwise , agrees to f/u with his PMD.     The patient appears reasonably screened and/or stabilized for discharge and I doubt any other medical condition or other [facility] requiring further screening, evaluation, or treatment in the ED at this time prior to discharge.    Tammy L. Trisha Mangle, PA-C 05/10/12 7846

## 2012-05-10 NOTE — ED Provider Notes (Signed)
Medical screening examination/treatment/procedure(s) were performed by non-physician practitioner and as supervising physician I was immediately available for consultation/collaboration.   Lyanne Co, MD 05/10/12 [phone]

## 2013-03-17 ENCOUNTER — Other Ambulatory Visit (HOSPITAL_COMMUNITY): Payer: Self-pay | Admitting: Physical Medicine and Rehabilitation

## 2013-03-17 DIAGNOSIS — M75100 Unspecified rotator cuff tear or rupture of unspecified shoulder, not specified as traumatic: Secondary | ICD-10-CM

## 2013-03-19 ENCOUNTER — Ambulatory Visit (HOSPITAL_COMMUNITY): Payer: BC Managed Care – PPO

## 2013-04-02 ENCOUNTER — Ambulatory Visit (HOSPITAL_COMMUNITY)
Admission: RE | Admit: 2013-04-02 | Discharge: 2013-04-02 | Disposition: A | Discharge status: Home / Self Care | Payer: BC Managed Care – PPO | Source: Ambulatory Visit | Attending: Physical Medicine and Rehabilitation | Admitting: Physical Medicine and Rehabilitation

## 2013-04-02 ENCOUNTER — Other Ambulatory Visit (HOSPITAL_COMMUNITY): Payer: Self-pay | Admitting: Physical Medicine and Rehabilitation

## 2013-04-02 DIAGNOSIS — G894 Chronic pain syndrome: Secondary | ICD-10-CM

## 2013-04-02 DIAGNOSIS — M1710 Unilateral primary osteoarthritis, unspecified knee: Secondary | ICD-10-CM

## 2013-04-02 DIAGNOSIS — M2540 Effusion, unspecified joint: Secondary | ICD-10-CM

## 2013-04-02 DIAGNOSIS — M25569 Pain in unspecified knee: Secondary | ICD-10-CM | POA: Insufficient documentation

## 2013-04-02 DIAGNOSIS — IMO0002 Reserved for concepts with insufficient information to code with codable children: Secondary | ICD-10-CM

## 2013-11-11 ENCOUNTER — Encounter (HOSPITAL_COMMUNITY): Payer: Self-pay | Admitting: Emergency Medicine

## 2013-11-11 ENCOUNTER — Emergency Department (HOSPITAL_COMMUNITY)
Admission: EM | Admit: 2013-11-11 | Discharge: 2013-11-11 | Disposition: A | Discharge status: Home / Self Care | Payer: Worker's Compensation | Attending: Emergency Medicine | Admitting: Emergency Medicine

## 2013-11-11 DIAGNOSIS — Y9389 Activity, other specified: Secondary | ICD-10-CM | POA: Insufficient documentation

## 2013-11-11 DIAGNOSIS — Y990 Civilian activity done for income or pay: Secondary | ICD-10-CM | POA: Diagnosis not present

## 2013-11-11 DIAGNOSIS — Z792 Long term (current) use of antibiotics: Secondary | ICD-10-CM | POA: Insufficient documentation

## 2013-11-11 DIAGNOSIS — W268XXA Contact with other sharp object(s), not elsewhere classified, initial encounter: Secondary | ICD-10-CM | POA: Diagnosis not present

## 2013-11-11 DIAGNOSIS — Z9889 Other specified postprocedural states: Secondary | ICD-10-CM | POA: Insufficient documentation

## 2013-11-11 DIAGNOSIS — Z862 Personal history of diseases of the blood and blood-forming organs and certain disorders involving the immune mechanism: Secondary | ICD-10-CM | POA: Diagnosis not present

## 2013-11-11 DIAGNOSIS — IMO0002 Reserved for concepts with insufficient information to code with codable children: Secondary | ICD-10-CM

## 2013-11-11 DIAGNOSIS — Z791 Long term (current) use of non-steroidal anti-inflammatories (NSAID): Secondary | ICD-10-CM | POA: Diagnosis not present

## 2013-11-11 DIAGNOSIS — Y9289 Other specified places as the place of occurrence of the external cause: Secondary | ICD-10-CM | POA: Diagnosis not present

## 2013-11-11 DIAGNOSIS — Z23 Encounter for immunization: Secondary | ICD-10-CM | POA: Insufficient documentation

## 2013-11-11 DIAGNOSIS — Z79899 Other long term (current) drug therapy: Secondary | ICD-10-CM | POA: Diagnosis not present

## 2013-11-11 DIAGNOSIS — S61209A Unspecified open wound of unspecified finger without damage to nail, initial encounter: Secondary | ICD-10-CM | POA: Insufficient documentation

## 2013-11-11 DIAGNOSIS — Z8639 Personal history of other endocrine, nutritional and metabolic disease: Secondary | ICD-10-CM | POA: Insufficient documentation

## 2013-11-11 HISTORY — DX: Pure hypercholesterolemia, unspecified: E78.00

## 2013-11-11 LAB — HEPATITIS PANEL, ACUTE
HCV Ab: NEGATIVE
HEP A IGM: NONREACTIVE
HEP B C IGM: NONREACTIVE
Hepatitis B Surface Ag: NEGATIVE

## 2013-11-11 LAB — RAPID HIV SCREEN (WH-MAU): Rapid HIV Screen: NONREACTIVE

## 2013-11-11 MED ORDER — TETANUS-DIPHTH-ACELL PERTUSSIS 5-2.5-18.5 LF-MCG/0.5 IM SUSP
0.5000 mL | Freq: Once | INTRAMUSCULAR | Status: AC
  Administered 2013-11-11: 0.5 mL via INTRAMUSCULAR
  Filled 2013-11-11: qty 0.5

## 2013-11-11 NOTE — ED Notes (Signed)
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

## 2013-11-11 NOTE — ED Notes (Signed)
At d/c pt taken to lab to do urine for workman's comp.

## 2013-11-11 NOTE — Discharge Instructions (Signed)
Laceration Care, Adult A laceration is a cut that goes through all layers of the skin. The cut goes into the tissue beneath the skin. HOME CARE  Keep the cut clean and dry.  If you have a bandage (dressing), change it at least once a day. Change the bandage if it gets wet or dirty, or as told by your doctor.  Wash the cut with soap and water 2 times a day. Rinse the cut with water. Pat it dry with a clean towel.  Have your stitches or staples removed as told by your doctor.    As discussed, the risk of you being exposed to infection from this razor blade injury is nearly zero, given the fact that it was a dry blade, not used at least since yesterday.  The likelihood of there being an infectious agent (HIV or hepatitis) is zero, given the mechanism of your injury.

## 2013-11-11 NOTE — ED Notes (Signed)
PT sent to ED from workplace d/t small laceration with a used razor blade to left hand middle finger. Bleeding control at this time. No signs of infection at this time.

## 2013-11-12 NOTE — ED Provider Notes (Signed)
CSN: [id number]     Arrival date & time 11/11/13  1036 History   First MD Initiated Contact with Patient 11/11/13 1106     Chief Complaint  Patient presents with  . Laceration     (Consider location/radiation/quality/duration/timing/severity/associated sxs/prior Treatment) The history is provided by the patient.   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  He was at work cleaning out inmates room when he cut his left distal long finger on a blade, wrapped between the teeth of a comb, which inmates frequently use to shave each other's hair.  The inmate and his whole block had been moved to an outside area earlier that morning in anticipation of cleaning the block.  Geoffrey [name] states that the blade was in all likelihood use no more recently been yesterday.  The wound site bled moderately and he washed the site with soap and water and also wound cleaner that was provided from the nurse's station.  His tetanus is out of date.  He denies any other injuries.    Past Medical History  Diagnosis Date  . Hypercholesteremia    Past Surgical History  Procedure Laterality Date  . Knee surgery    . Hernia repair    . Cardiac catheterization     History reviewed. No pertinent family history. History  Substance Use Topics  . Smoking status: Never Smoker   . Smokeless tobacco: Not on file  . Alcohol Use: Yes     Comment: occassional    Review of Systems  Skin: Positive for wound.  Neurological: Negative for numbness.  All other systems reviewed and are negative.     Allergies  Review of patient's allergies indicates no known allergies.  Home Medications   Prior to Admission medications   Medication Sig Start Date End Date Taking? Authorizing Provider  ALPRAZolam Duanne Moron) 0.5 MG tablet Take 0.5 mg by mouth 3 (three) times daily as needed for sleep or anxiety.    Historical Provider, MD  azithromycin (ZITHROMAX) 250 MG  tablet Take 250-500 mg by mouth daily. Take 500mg  on day 1 then 250mg  daily for 4 days. Started 05/06/12    Historical Provider, MD  CYANOCOBALAMIN PO Take 1 tablet by mouth daily. Unknown strength    Historical Provider, MD  cyclobenzaprine (FLEXERIL) 10 MG tablet Take 1 tablet (10 mg total) by mouth 3 (three) times daily as needed for muscle spasms. 05/09/12   Tammy L. Triplett, PA-C  diclofenac (FLECTOR) 1.3 % PTCH Place 1 patch onto the skin daily as needed.    Historical Provider, MD  esomeprazole (NEXIUM) 20 MG capsule Take 20 mg by mouth daily.    Historical Provider, MD  fish oil-omega-3 fatty acids 1000 MG capsule Take 2 g by mouth daily.    Historical Provider, MD  Glucosamine-Chondroitin (GLUCOSAMINE CHONDR COMPLEX PO) Take 1 tablet by mouth daily. Unknown strength    Historical Provider, MD  HYDROcodone-homatropine (HYCODAN) 5-1.5 MG/5ML syrup Take 10 mLs by mouth every 6 (six) hours as needed for cough.    Historical Provider, MD  lidocaine (LIDODERM) 5 % Place 1 patch onto the skin daily. Remove & Discard patch within 12 hours or as directed by MD    Historical Provider, MD  Multiple Vitamin (MULTIVITAMIN WITH MINERALS) TABS Take 1 tablet by mouth daily.    Historical Provider, MD  Pyridoxine HCl (B-6 PO) Take 1 tablet by mouth daily. Unknown strength  Historical Provider, MD  traMADol (ULTRAM) 50 MG tablet Take 1 tablet (50 mg total) by mouth every 6 (six) hours as needed for pain. 05/09/12   Tammy L. Triplett, PA-C   BP 132/92  Pulse 83  Temp(Src) 98.9 F (37.2 C) (Oral)  Resp 18  Ht 6\' 2"  (1.88 m)  Wt 226 lb (102.513 kg)  BMI 29.00 kg/m2  SpO2 97% Physical Exam  Constitutional: He is oriented to person, place, and time. He appears well-developed and well-nourished.  HENT:  Head: Normocephalic.  Cardiovascular: Normal rate.   Pulmonary/Chest: Effort normal.  Musculoskeletal: He exhibits no tenderness.  Neurological: He is alert and oriented to person, place, and time. No  sensory deficit.  Skin: Laceration noted.  3 mm superficial well approximated laceration left distal long finger which is hemostatic.    ED Course  Procedures (including critical care time) Labs Review Labs Reviewed  RAPID HIV SCREEN ([facility])  HEPATITIS PANEL, ACUTE    Imaging Review No results found.   EKG Interpretation None      MDM   Final diagnoses:  Laceration     Patient's tetanus was updated today.  Wound is well approximated and sealed.  There is no indication for sutures or any other wound repair.  The patient and employer were concerned for possibility of blood-borne infections with this injury.  Since this was not a blood to blood contact and this blade had not been used since yesterday, possibly even longer, there is no risk of HIV or hepatitis virus being on this object.  We did draw an exposure panel today.  Prophylactic HIV treatment is not indicated at this time.  Patient was also sent to our lab for a work related injury screening tests as well as requested by his employer.    Patient was discussed with Dr. Lacinda Axon prior to discharge home.      Evalee Jefferson, PA-C 11/12/13 [phone]

## 2013-11-13 NOTE — ED Provider Notes (Signed)
Medical screening examination/treatment/procedure(s) were performed by non-physician practitioner and as supervising physician I was immediately available for consultation/collaboration.   EKG Interpretation None       Nat Christen, MD 11/13/13 [phone]

## 2014-03-04 HISTORY — PX: COLONOSCOPY: SHX174

## 2015-01-03 ENCOUNTER — Other Ambulatory Visit: Payer: Self-pay | Admitting: *Deleted

## 2015-01-03 DIAGNOSIS — I83893 Varicose veins of bilateral lower extremities with other complications: Secondary | ICD-10-CM

## 2015-01-05 ENCOUNTER — Encounter: Payer: Self-pay | Admitting: Vascular Surgery

## 2015-01-06 ENCOUNTER — Ambulatory Visit (HOSPITAL_COMMUNITY)
Admission: RE | Admit: 2015-01-06 | Discharge: 2015-01-06 | Disposition: A | Discharge status: Home / Self Care | Payer: BC Managed Care – PPO | Source: Ambulatory Visit | Attending: Vascular Surgery | Admitting: Vascular Surgery

## 2015-01-06 DIAGNOSIS — I83893 Varicose veins of bilateral lower extremities with other complications: Secondary | ICD-10-CM | POA: Insufficient documentation

## 2015-01-09 ENCOUNTER — Ambulatory Visit (INDEPENDENT_AMBULATORY_CARE_PROVIDER_SITE_OTHER): Payer: BC Managed Care – PPO | Admitting: Vascular Surgery

## 2015-01-09 ENCOUNTER — Encounter: Payer: Self-pay | Admitting: Vascular Surgery

## 2015-01-09 VITALS — BP 134/85 | HR 72 | Temp 97.6°F | Resp 18 | Ht 74.0 in | Wt 236.0 lb

## 2015-01-09 DIAGNOSIS — I83891 Varicose veins of right lower extremities with other complications: Secondary | ICD-10-CM | POA: Diagnosis not present

## 2015-01-09 NOTE — Progress Notes (Signed)
Subjective:     Patient ID: Geoffrey [name], male   DOB: March 18, 1955, 59 y.o.   MRN: [id number]  HPI this 59 year old male returns having previously undergone laser ablation of the right small saphenous vein on 2 occasions in 2007 and 2012. He had a good result and over the past year or so has developed recurrent varicosities on the medial aspect of the right calf extending down into the ankle with chronic edema. He has no history of DVT or thrombophlebitis stasis ulcers or bleeding. His edema has progressed. He has not worn his elastic compression stockings. He has bluish discoloration extending down into the medial ankle area. He also has significant right knee pain from known osteoarthritis. His discomfort seems to be twofold. Some is in the knee joint and some is in the medial 5 medial calf and in the ankle area where the swelling occurs.  Past Medical History  Diagnosis Date  . Hypercholesteremia   . Varicose veins     Social History  Substance Use Topics  . Smoking status: Never Smoker   . Smokeless tobacco: Never Used  . Alcohol Use: 0.0 oz/week    0 Standard drinks or equivalent per week     Comment: occassional    No family history on file.  No Known Allergies   Current outpatient prescriptions:  .  ALPRAZolam (XANAX) 0.5 MG tablet, Take 0.5 mg by mouth 3 (three) times daily as needed for sleep or anxiety., Disp: , Rfl:  .  esomeprazole (NEXIUM) 20 MG capsule, Take 20 mg by mouth daily., Disp: , Rfl:  .  fish oil-omega-3 fatty acids 1000 MG capsule, Take 2 g by mouth daily., Disp: , Rfl:  .  Flaxseed, Linseed, (FLAXSEED OIL PO), Take by mouth daily., Disp: , Rfl:  .  Glucosamine-Chondroitin (GLUCOSAMINE CHONDR COMPLEX PO), Take 1 tablet by mouth daily. Unknown strength, Disp: , Rfl:  .  Misc Natural Products (TART CHERRY ADVANCED PO), Take by mouth daily., Disp: , Rfl:  .  Multiple Vitamin (MULTIVITAMIN WITH MINERALS) TABS, Take 1 tablet by mouth daily., Disp: , Rfl:  .   Probiotic Product (PROBIOTIC DAILY PO), Take by mouth daily., Disp: , Rfl:  .  Pyridoxine HCl (B-6 PO), Take 1 tablet by mouth daily. Unknown strength, Disp: , Rfl:  .  azithromycin (ZITHROMAX) 250 MG tablet, Take 250-500 mg by mouth daily. Take 500mg  on day 1 then 250mg  daily for 4 days. Started 05/06/12, Disp: , Rfl:  .  CYANOCOBALAMIN PO, Take 1 tablet by mouth daily. Unknown strength, Disp: , Rfl:  .  cyclobenzaprine (FLEXERIL) 10 MG tablet, Take 1 tablet (10 mg total) by mouth 3 (three) times daily as needed for muscle spasms. (Patient not taking: Reported on 01/09/2015), Disp: 21 tablet, Rfl: 0 .  diclofenac (FLECTOR) 1.3 % PTCH, Place 1 patch onto the skin daily as needed., Disp: , Rfl:  .  HYDROcodone-homatropine (HYCODAN) 5-1.5 MG/5ML syrup, Take 10 mLs by mouth every 6 (six) hours as needed for cough., Disp: , Rfl:  .  lidocaine (LIDODERM) 5 %, Place 1 patch onto the skin daily. Remove & Discard patch within 12 hours or as directed by MD, Disp: , Rfl:  .  traMADol (ULTRAM) 50 MG tablet, Take 1 tablet (50 mg total) by mouth every 6 (six) hours as needed for pain. (Patient not taking: Reported on 01/09/2015), Disp: 15 tablet, Rfl: 0  Filed Vitals:   01/09/15 1420  BP: 134/85  Pulse: 72  Temp: 97.6 F (  36.4 C)  Resp: 18  Height: 6\' 2"  (1.88 m)  Weight: 236 lb (107.049 kg)  SpO2: 98%    Body mass index is 30.29 kg/(m^2).           Review of Systems denies chest pain, dyspnea on exertion, PND, orthopnea, hemoptysis     Objective:   Physical Exam BP 134/85 mmHg  Pulse 72  Temp(Src) 97.6 F (36.4 C)  Resp 18  Ht 6\' 2"  (1.88 m)  Wt 236 lb (107.049 kg)  BMI 30.29 kg/m2  SpO2 98%  Gen.-alert and oriented x3 in no apparent distress HEENT normal for age Lungs no rhonchi or wheezing Cardiovascular regular rhythm no murmurs carotid pulses 3+ palpable no bruits audible Abdomen soft nontender no palpable masses Musculoskeletal free of  major deformities Skin clear -no  rashes Neurologic normal Lower extremities 3+ femoral and dorsalis pedis pulses palpable bilaterally with no edema on the left 1+ edema on the right Extensive network of small varicosities in prominent reticular vein patterns extending down the medial calf into the medial malleolar area and onto the dorsum of the foot. Hyperpigmentation is noted but no active ulceration is noted. Left leg is free of significant varicosities.  Today I ordered a venous duplex exam of the right leg which I reviewed and interpreted. There is no DVT. There is deep vein reflux in the right leg. There is also now gross reflux in the right great saphenous vein which was not present when patient was previously evaluated. He has partially recanalized his small saphenous vein on the right and it is quite tortuous and partially thrombosed throughout. There is irregular webbing noted.       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 Patient's symptoms are affecting his daily living and ability to work. He works on his feet. Osteoarthritis right knee Status post laser ablation right small saphenous vein    Plan:         #1 long leg elastic compression stockings 20-30 mm gradient #2 elevate legs as much as possible #3 ibuprofen daily on a regular basis for pain #4 return in 3 months-if no significant improvement then he will need laser ablation right great saphenous vein +3 courses of sclerotherapy right leg Patient return in 3 months

## 2015-04-05 ENCOUNTER — Encounter: Payer: Self-pay | Admitting: Vascular Surgery

## 2015-04-11 ENCOUNTER — Ambulatory Visit (INDEPENDENT_AMBULATORY_CARE_PROVIDER_SITE_OTHER): Payer: BC Managed Care – PPO | Admitting: Vascular Surgery

## 2015-04-11 VITALS — BP 132/77 | HR 80 | Temp 97.6°F | Resp 18 | Ht 74.0 in | Wt 232.0 lb

## 2015-04-11 DIAGNOSIS — I83891 Varicose veins of right lower extremities with other complications: Secondary | ICD-10-CM | POA: Insufficient documentation

## 2015-04-11 NOTE — Progress Notes (Signed)
Subjective:     Patient ID: Geoffrey [name], male   DOB: 10-31-1955, 60 y.o.   MRN: [id number]  HPI this 60 year old male returns for continued follow-up regarding his painful varicosities in the right leg. He has tried long leg elastic compression stockings 20-30 mm gradient as well as elevation and ibuprofen but continues to have pain which is worsening as the day progresses. He also developed swelling in the ankle area. He does continue to work and this is beginning to affect his daily living and ability to work. He has no history of DVT thrombophlebitis stasis ulcers or bleeding. He did have previous laser ablation of the right small saphenous vein in 2012 but has never had the right great saphenous vein treated.  Past Medical History  Diagnosis Date  . Hypercholesteremia   . Varicose veins     Social History  Substance Use Topics  . Smoking status: Never Smoker   . Smokeless tobacco: Never Used  . Alcohol Use: 0.0 oz/week    0 Standard drinks or equivalent per week     Comment: occassional    No family history on file.  No Known Allergies   Current outpatient prescriptions:  .  ALPRAZolam (XANAX) 0.5 MG tablet, Take 0.5 mg by mouth 3 (three) times daily as needed for sleep or anxiety., Disp: , Rfl:  .  esomeprazole (NEXIUM) 20 MG capsule, Take 20 mg by mouth daily. Reported on 04/11/2015, Disp: , Rfl:  .  Flaxseed, Linseed, (FLAXSEED OIL PO), Take by mouth daily., Disp: , Rfl:  .  Glucosamine-Chondroitin (GLUCOSAMINE CHONDR COMPLEX PO), Take 1 tablet by mouth daily. Unknown strength, Disp: , Rfl:  .  Multiple Vitamin (MULTIVITAMIN WITH MINERALS) TABS, Take 1 tablet by mouth daily., Disp: , Rfl:  .  Probiotic Product (PROBIOTIC DAILY PO), Take by mouth daily., Disp: , Rfl:  .  azithromycin (ZITHROMAX) 250 MG tablet, Take 250-500 mg by mouth daily. Reported on 04/11/2015, Disp: , Rfl:  .  CYANOCOBALAMIN PO, Take 1 tablet by mouth daily. Reported on 04/11/2015, Disp: , Rfl:  .   cyclobenzaprine (FLEXERIL) 10 MG tablet, Take 1 tablet (10 mg total) by mouth 3 (three) times daily as needed for muscle spasms. (Patient not taking: Reported on 01/09/2015), Disp: 21 tablet, Rfl: 0 .  diclofenac (FLECTOR) 1.3 % PTCH, Place 1 patch onto the skin daily as needed. Reported on 04/11/2015, Disp: , Rfl:  .  fish oil-omega-3 fatty acids 1000 MG capsule, Take 2 g by mouth daily. Reported on 04/11/2015, Disp: , Rfl:  .  HYDROcodone-homatropine (HYCODAN) 5-1.5 MG/5ML syrup, Take 10 mLs by mouth every 6 (six) hours as needed for cough. Reported on 04/11/2015, Disp: , Rfl:  .  lidocaine (LIDODERM) 5 %, Place 1 patch onto the skin daily. Reported on 04/11/2015, Disp: , Rfl:  .  Misc Natural Products (TART CHERRY ADVANCED PO), Take by mouth daily. Reported on 04/11/2015, Disp: , Rfl:  .  Pyridoxine HCl (B-6 PO), Take 1 tablet by mouth daily. Reported on 04/11/2015, Disp: , Rfl:  .  traMADol (ULTRAM) 50 MG tablet, Take 1 tablet (50 mg total) by mouth every 6 (six) hours as needed for pain. (Patient not taking: Reported on 01/09/2015), Disp: 15 tablet, Rfl: 0  Filed Vitals:   04/11/15 0852  BP: 132/77  Pulse: 80  Temp: 97.6 F (36.4 C)  Resp: 18  Height: 6\' 2"  (1.88 m)  Weight: 232 lb (105.235 kg)  SpO2: 98%    Body mass index  is 29.77 kg/(m^2).           Review of Systems denies chest pain, dyspnea on exertion, PND, orthopnea, hemoptysis    Objective:   Physical Exam BP 132/77 mmHg  Pulse 80  Temp(Src) 97.6 F (36.4 C)  Resp 18  Ht 6\' 2"  (1.88 m)  Wt 232 lb (105.235 kg)  BMI 29.77 kg/m2  SpO2 98%  General alert and oriented 3 in no apparent distress Lungs no rhonchi or wheezing Right leg with 1+ edema lower third with diffuse reticular veins with network extending around to the medial and lateral malleolar areas. Early hyperpigmentation lower third right leg. No active ulcer noted.  Patient has documented gross reflux in right great saphenous vein from the saphenofemoral  junction down to the midcalf supplying these painful varicosities.     Assessment:     Painful varicosities right leg due to gross reflux right great saphenous vein. Symptoms are affecting patient's daily living and ability to work and are resistant to conservative measures including 20-30 millimeter gradient stockings, elevation, and ibuprofen.    Plan:     Patient needs laser ablation right great saphenous vein followed by 3 courses of sclerotherapy to treat his problem and hopefully relieve his symptoms. We will proceed with precertification to perform this in the near future

## 2015-04-14 ENCOUNTER — Other Ambulatory Visit: Payer: Self-pay | Admitting: *Deleted

## 2015-04-14 DIAGNOSIS — I83811 Varicose veins of right lower extremities with pain: Secondary | ICD-10-CM

## 2015-05-17 ENCOUNTER — Encounter: Payer: Self-pay | Admitting: Vascular Surgery

## 2015-05-22 ENCOUNTER — Encounter: Payer: Self-pay | Admitting: Vascular Surgery

## 2015-05-22 ENCOUNTER — Ambulatory Visit (INDEPENDENT_AMBULATORY_CARE_PROVIDER_SITE_OTHER): Payer: BC Managed Care – PPO | Admitting: Vascular Surgery

## 2015-05-22 VITALS — BP 127/78 | HR 105 | Temp 97.2°F | Resp 18 | Ht 74.0 in | Wt 230.0 lb

## 2015-05-22 DIAGNOSIS — I83891 Varicose veins of right lower extremities with other complications: Secondary | ICD-10-CM | POA: Diagnosis not present

## 2015-05-22 NOTE — Progress Notes (Signed)
Subjective:     Patient ID: Geoffrey [name], male   DOB: [birth year], 60 y.o.   MRN: [id number]  HPI this 60 year old male had laser ablation of the right great saphenous vein performed under local tumescent anesthesia for gross reflux. A total of 2170 J of energy was utilized. He tolerated the procedure well.   Review of Systems     Objective:   Physical Exam BP 127/78 mmHg  Pulse 105  Temp(Src) 97.2 F (36.2 C)  Resp 18  Ht 6\' 2"  (1.88 m)  Wt 230 lb (104.327 kg)  BMI 29.52 kg/m2  SpO2 96%       Assessment:     Well-tolerated laser ablation right great saphenous vein performed under local tumescent anesthesia    Plan:     Return in 1 week for venous duplex exam to confirm closure right great saphenous vein and this will complete patient's treatment regimen

## 2015-05-22 NOTE — Progress Notes (Signed)
Laser Ablation Procedure    Date: 05/22/2015   Geoffrey [name] DOB:[birth year]  Consent signed: Yes    Surgeon:  Dr. Nelda Severe. Kellie Simmering  Procedure: Laser Ablation: right Greater Saphenous Vein  BP 127/78 mmHg  Pulse 105  Temp(Src) 97.2 F (36.2 C)  Resp 18  Ht 6\' 2"  (1.88 m)  Wt 230 lb (104.327 kg)  BMI 29.52 kg/m2  SpO2 96%  Tumescent Anesthesia: 410 cc 0.9% NaCl with 50 cc Lidocaine HCL with 1% Epi and 15 cc 8.4% NaHCO3  Local Anesthesia: 6 cc Lidocaine HCL and NaHCO3 (ratio 2:1)  Pulsed Mode: 15 watts, 557ms delay, 1.0 duration  Total Energy: 2170             Total Pulses:   145             Total Time: 2:25   Patient tolerated procedure well  Notes:   Description of Procedure:  After marking the course of the secondary varicosities, the patient was placed on the operating table in the supine position, and the right leg was prepped and draped in sterile fashion.   Local anesthetic was administered and under ultrasound guidance the saphenous vein was accessed with a micro needle and guide wire; then the mirco puncture sheath was placed.  A guide wire was inserted saphenofemoral junction , followed by a 5 french sheath.  The position of the sheath and then the laser fiber below the junction was confirmed using the ultrasound.  Tumescent anesthesia was administered along the course of the saphenous vein using ultrasound guidance. The patient was placed in Trendelenburg position and protective laser glasses were placed on patient and staff, and the laser was fired at 15 watts continuous mode advancing 1-3mm/second for a total of 2170 joules.     Steri strips were applied to the stab wounds and ABD pads and thigh high compression stockings were applied.  Ace wrap bandages were applied over the phlebectomy sites and at the top of the saphenofemoral junction. Blood loss was less than 15 cc.  The patient ambulated out of the operating room having tolerated the procedure well.

## 2015-05-23 ENCOUNTER — Telehealth: Payer: Self-pay | Admitting: *Deleted

## 2015-05-23 NOTE — Telephone Encounter (Signed)
Asked pt to call if he is having any problems.

## 2015-05-29 ENCOUNTER — Ambulatory Visit (HOSPITAL_COMMUNITY)
Admission: RE | Admit: 2015-05-29 | Discharge: 2015-05-29 | Disposition: A | Discharge status: Home / Self Care | Payer: BC Managed Care – PPO | Source: Ambulatory Visit | Attending: Vascular Surgery | Admitting: Vascular Surgery

## 2015-05-29 ENCOUNTER — Encounter: Payer: Self-pay | Admitting: Vascular Surgery

## 2015-05-29 ENCOUNTER — Ambulatory Visit (INDEPENDENT_AMBULATORY_CARE_PROVIDER_SITE_OTHER): Payer: BC Managed Care – PPO | Admitting: Vascular Surgery

## 2015-05-29 VITALS — BP 128/85 | HR 72 | Temp 96.9°F | Resp 16 | Ht 74.0 in | Wt 237.0 lb

## 2015-05-29 DIAGNOSIS — E7800 Pure hypercholesterolemia, unspecified: Secondary | ICD-10-CM | POA: Diagnosis not present

## 2015-05-29 DIAGNOSIS — I83811 Varicose veins of right lower extremities with pain: Secondary | ICD-10-CM | POA: Diagnosis not present

## 2015-05-29 DIAGNOSIS — I83891 Varicose veins of right lower extremities with other complications: Secondary | ICD-10-CM

## 2015-05-29 DIAGNOSIS — Z9889 Other specified postprocedural states: Secondary | ICD-10-CM | POA: Insufficient documentation

## 2015-05-29 NOTE — Progress Notes (Signed)
Subjective:     Patient ID: Geoffrey [name], male   DOB: 02/04/1956, 60 y.o.   MRN: [id number]  HPI this 60 year old male returns 1 week post-laser ablation right great saphenous vein for chronic swelling and painful varicosities. He has had minimal discomfort related to the laser ablation. He has one his elastic compression stockings and take an ibuprofen as instructed. He has had no specific complaints.  Past Medical History  Diagnosis Date  . Hypercholesteremia   . Varicose veins     Social History  Substance Use Topics  . Smoking status: Never Smoker   . Smokeless tobacco: Never Used  . Alcohol Use: 0.0 oz/week    0 Standard drinks or equivalent per week     Comment: occassional    No family history on file.  No Known Allergies   Current outpatient prescriptions:  .  ALPRAZolam (XANAX) 0.5 MG tablet, Take 0.5 mg by mouth 3 (three) times daily as needed for sleep or anxiety., Disp: , Rfl:  .  CYANOCOBALAMIN PO, Take 1 tablet by mouth daily. Reported on 04/11/2015, Disp: , Rfl:  .  esomeprazole (NEXIUM) 20 MG capsule, Take 20 mg by mouth daily. Reported on 04/11/2015, Disp: , Rfl:  .  Flaxseed, Linseed, (FLAXSEED OIL PO), Take by mouth daily., Disp: , Rfl:  .  Glucosamine-Chondroitin (GLUCOSAMINE CHONDR COMPLEX PO), Take 1 tablet by mouth daily. Unknown strength, Disp: , Rfl:  .  Multiple Vitamin (MULTIVITAMIN WITH MINERALS) TABS, Take 1 tablet by mouth daily., Disp: , Rfl:  .  Probiotic Product (PROBIOTIC DAILY PO), Take by mouth daily., Disp: , Rfl:  .  azithromycin (ZITHROMAX) 250 MG tablet, Take 250-500 mg by mouth daily. Reported on 05/29/2015, Disp: , Rfl:  .  cyclobenzaprine (FLEXERIL) 10 MG tablet, Take 1 tablet (10 mg total) by mouth 3 (three) times daily as needed for muscle spasms. (Patient not taking: Reported on 01/09/2015), Disp: 21 tablet, Rfl: 0 .  diclofenac (FLECTOR) 1.3 % PTCH, Place 1 patch onto the skin daily as needed. Reported on 05/29/2015, Disp: , Rfl:  .  fish  oil-omega-3 fatty acids 1000 MG capsule, Take 2 g by mouth daily. Reported on 05/29/2015, Disp: , Rfl:  .  HYDROcodone-homatropine (HYCODAN) 5-1.5 MG/5ML syrup, Take 10 mLs by mouth every 6 (six) hours as needed for cough. Reported on 05/29/2015, Disp: , Rfl:  .  lidocaine (LIDODERM) 5 %, Place 1 patch onto the skin daily. Reported on 05/29/2015, Disp: , Rfl:  .  Misc Natural Products (TART CHERRY ADVANCED PO), Take by mouth daily. Reported on 05/29/2015, Disp: , Rfl:  .  Pyridoxine HCl (B-6 PO), Take 1 tablet by mouth daily. Reported on 05/29/2015, Disp: , Rfl:  .  traMADol (ULTRAM) 50 MG tablet, Take 1 tablet (50 mg total) by mouth every 6 (six) hours as needed for pain. (Patient not taking: Reported on 01/09/2015), Disp: 15 tablet, Rfl: 0  Filed Vitals:   05/29/15 1402  BP: 128/85  Pulse: 72  Temp: 96.9 F (36.1 C)  Resp: 16  Height: 6\' 2"  (1.88 m)  Weight: 237 lb (107.502 kg)  SpO2: 97%    Body mass index is 30.42 kg/(m^2).           Review of Systems denies chest pain, dyspnea on exertion, PND, orthopnea, hemoptysis, claudication     Objective:   Physical Exam BP 128/85 mmHg  Pulse 72  Temp(Src) 96.9 F (36.1 C)  Resp 16  Ht 6\' 2"  (1.88 m)  Wt  237 lb (107.502 kg)  BMI 30.42 kg/m2  SpO2 97%  General well-developed well-nourished male no apparent distress alert and oriented 3 Lungs no rhonchi or wheezing Cardiovascular regular rhythm no murmurs Right leg with mild discomfort along course of great saphenous vein from the inguinal area to the proximal calf. No ecchymosis noted. Mild distal edema. Extensive network of reticular and spider veins lower third right leg with no active ulcer.  Today or venous duplex exam of the right leg which I reviewed and interpreted. There is no DVT. There is total closure of the right great saphenous vein from the proximal calf to near the saphenofemoral junction.     Assessment:     Successful laser ablation right great saphenous  vein for gross reflux with pain and swelling    Plan:     Patient will return for sclerotherapy which will complete his treatment regimen

## 2015-05-31 ENCOUNTER — Ambulatory Visit (INDEPENDENT_AMBULATORY_CARE_PROVIDER_SITE_OTHER): Payer: BC Managed Care – PPO | Admitting: *Deleted

## 2015-05-31 DIAGNOSIS — I83891 Varicose veins of right lower extremities with other complications: Secondary | ICD-10-CM | POA: Diagnosis not present

## 2015-05-31 NOTE — Progress Notes (Signed)
X=.3% Sotradecol administered with a 27g butterfly.  Patient received a total of 12cc.  Treated all spider veins on right calf and ankle. Easy access. Tol well. He will return in may for his 2nd tx. Follow prn.    Photos: No.  Compression stockings applied: Yes.  and a 4" ace wrap.

## 2015-06-01 ENCOUNTER — Encounter: Payer: Self-pay | Admitting: Vascular Surgery

## 2015-07-07 ENCOUNTER — Encounter: Payer: Self-pay | Admitting: *Deleted

## 2015-07-12 ENCOUNTER — Ambulatory Visit (INDEPENDENT_AMBULATORY_CARE_PROVIDER_SITE_OTHER): Payer: BC Managed Care – PPO | Admitting: *Deleted

## 2015-07-12 ENCOUNTER — Encounter: Payer: Self-pay | Admitting: Vascular Surgery

## 2015-07-12 DIAGNOSIS — I83891 Varicose veins of right lower extremities with other complications: Secondary | ICD-10-CM

## 2015-07-12 NOTE — Progress Notes (Signed)
X=.3% Sotradecol administered with a 27g butterfly.  Patient received a total of 6cc.  Pt having soreness/sensitivity on inner foot and ankle where previous tx occurred. I stayed away from those areas and injected any other areas I saw. Easy access. He was jumpy. This is the last ins covered tx. Follow prn.  Photos: No.  Compression stockings applied: Yes.

## 2015-07-13 ENCOUNTER — Ambulatory Visit: Payer: Self-pay

## 2016-05-09 ENCOUNTER — Other Ambulatory Visit (HOSPITAL_COMMUNITY): Payer: Self-pay | Admitting: Orthopedic Surgery

## 2016-05-09 DIAGNOSIS — M25561 Pain in right knee: Secondary | ICD-10-CM

## 2016-05-15 ENCOUNTER — Encounter (HOSPITAL_COMMUNITY): Payer: Self-pay

## 2016-05-15 ENCOUNTER — Ambulatory Visit (HOSPITAL_COMMUNITY): Payer: BC Managed Care – PPO

## 2018-02-06 ENCOUNTER — Encounter: Payer: Self-pay | Admitting: Gastroenterology

## 2018-04-27 ENCOUNTER — Ambulatory Visit: Payer: BC Managed Care – PPO | Admitting: Gastroenterology

## 2018-04-27 ENCOUNTER — Encounter: Payer: Self-pay | Admitting: Gastroenterology

## 2018-04-27 DIAGNOSIS — K219 Gastro-esophageal reflux disease without esophagitis: Secondary | ICD-10-CM

## 2018-04-27 DIAGNOSIS — R1013 Epigastric pain: Secondary | ICD-10-CM | POA: Diagnosis not present

## 2018-04-27 DIAGNOSIS — R1310 Dysphagia, unspecified: Secondary | ICD-10-CM | POA: Diagnosis not present

## 2018-04-27 DIAGNOSIS — R1319 Other dysphagia: Secondary | ICD-10-CM | POA: Insufficient documentation

## 2018-04-27 DIAGNOSIS — R109 Unspecified abdominal pain: Secondary | ICD-10-CM | POA: Insufficient documentation

## 2018-04-27 NOTE — Assessment & Plan Note (Signed)
Very pleasant 63 year old gentleman with history of chronic GERD for greater than 5 years, ongoing heartburn, vague solid food dysphagia.  We discussed screening for Barrett's esophagus as well as complicated GERD such as esophageal web/ring/stricture.  He has epigastric pain in the setting of moderate diclofenac use.  Cannot rule out gastritis or ulcer.  I suspect majority of his abdominal discomfort is related to rectus diastases.  Plan for EGD plus or minus esophageal dilation with Dr. fields in the near future.  Augment conscious sedation with Phenergan 12.5 mg IV 30 minutes before the procedure.  I have discussed the risks, alternatives, benefits with regards to but not limited to the risk of reaction to medication, bleeding, infection, perforation and the patient is agreeable to proceed. Written consent to be obtained.  He will resume pantoprazole 40 mg daily before breakfast.  Use Pepcid Complete as twice daily as needed for breakthrough symptoms.  Reinforced antireflux measures.  We will obtain copy of CT report for review.  Have requested colonoscopy report for review as well.

## 2018-04-27 NOTE — Patient Instructions (Signed)
1. Start back on pantoprazole once daily 30 minutes before breakfast.  2. You can still use Pepcid Complete twice daily as needed. 3. We will obtain copies of prior colonoscopy and CT reports for your records. 4. Upper endoscopy with Dr. Oneida Alar as scheduled.  Please see separate instructions.    Food Choices for Gastroesophageal Reflux Disease, Adult When you have gastroesophageal reflux disease (GERD), the foods you eat and your eating habits are very important. Choosing the right foods can help ease the discomfort of GERD. Consider working with a diet and nutrition specialist (dietitian) to help you make healthy food choices. What general guidelines should I follow?  Eating plan  Choose healthy foods low in fat, such as fruits, vegetables, whole grains, low-fat dairy products, and lean meat, fish, and poultry.  Eat frequent, small meals instead of three large meals each day. Eat your meals slowly, in a relaxed setting. Avoid bending over or lying down until 2-3 hours after eating.  Limit high-fat foods such as fatty meats or fried foods.  Limit your intake of oils, butter, and shortening to less than 8 teaspoons each day.  Avoid the following: ? Foods that cause symptoms. These may be different for different people. Keep a food diary to keep track of foods that cause symptoms. ? Alcohol. ? Drinking large amounts of liquid with meals. ? Eating meals during the 2-3 hours before bed.  Cook foods using methods other than frying. This may include baking, grilling, or broiling. Lifestyle  Maintain a healthy weight. Ask your health care provider what weight is healthy for you. If you need to lose weight, work with your health care provider to do so safely.  Exercise for at least 30 minutes on 5 or more days each week, or as told by your health care provider.  Avoid wearing clothes that fit tightly around your waist and chest.  Do not use any products that contain nicotine or tobacco,  such as cigarettes and e-cigarettes. If you need help quitting, ask your health care provider.  Sleep with the head of your bed raised. Use a wedge under the mattress or blocks under the bed frame to raise the head of the bed. What foods are not recommended? The items listed may not be a complete list. Talk with your dietitian about what dietary choices are best for you. Grains Pastries or quick breads with added fat. [place] toast. Vegetables Deep fried vegetables. [place] fries. Any vegetables prepared with added fat. Any vegetables that cause symptoms. For some people this may include tomatoes and tomato products, chili peppers, onions and garlic, and horseradish. Fruits Any fruits prepared with added fat. Any fruits that cause symptoms. For some people this may include citrus fruits, such as oranges, grapefruit, pineapple, and lemons. Meats and other protein foods High-fat meats, such as fatty beef or pork, hot dogs, ribs, ham, sausage, salami and bacon. Fried meat or protein, including fried fish and fried chicken. Nuts and nut butters. Dairy Whole milk and chocolate milk. Sour cream. Cream. Ice cream. Cream cheese. Milk shakes. Beverages Coffee and tea, with or without caffeine. Carbonated beverages. Sodas. Energy drinks. Fruit juice made with acidic fruits (such as orange or grapefruit). Tomato juice. Alcoholic drinks. Fats and oils Butter. Margarine. Shortening. Ghee. Sweets and desserts Chocolate and cocoa. Donuts. Seasoning and other foods Pepper. Peppermint and spearmint. Any condiments, herbs, or seasonings that cause symptoms. For some people, this may include curry, hot sauce, or vinegar-based salad dressings. Summary  When you  have gastroesophageal reflux disease (GERD), food and lifestyle choices are very important to help ease the discomfort of GERD.  Eat frequent, small meals instead of three large meals each day. Eat your meals slowly, in a relaxed setting. Avoid bending  over or lying down until 2-3 hours after eating.  Limit high-fat foods such as fatty meat or fried foods. This information is not intended to replace advice given to you by your health care provider. Make sure you discuss any questions you have with your health care provider. Document Released: 02/18/2005 Document Revised: 02/20/2016 Document Reviewed: 02/20/2016 Elsevier Interactive Patient Education  2019 [place].   Gastroesophageal Reflux Disease, Adult Gastroesophageal reflux (GER) happens when acid from the stomach flows up into the tube that connects the mouth and the stomach (esophagus). Normally, food travels down the esophagus and stays in the stomach to be digested. However, when a person has GER, food and stomach acid sometimes move back up into the esophagus. If this becomes a more serious problem, the person may be diagnosed with a disease called gastroesophageal reflux disease (GERD). GERD occurs when the reflux:  Happens often.  Causes frequent or severe symptoms.  Causes problems such as damage to the esophagus. When stomach acid comes in contact with the esophagus, the acid may cause soreness (inflammation) in the esophagus. Over time, GERD may create small holes (ulcers) in the lining of the esophagus. What are the causes? This condition is caused by a problem with the muscle between the esophagus and the stomach (lower esophageal sphincter, or LES). Normally, the LES muscle closes after food passes through the esophagus to the stomach. When the LES is weakened or abnormal, it does not close properly, and that allows food and stomach acid to go back up into the esophagus. The LES can be weakened by certain dietary substances, medicines, and medical conditions, including:  Tobacco use.  Pregnancy.  Having a hiatal hernia.  Alcohol use.  Certain foods and beverages, such as coffee, chocolate, onions, and peppermint. What increases the risk? You are more likely to  develop this condition if you:  Have an increased body weight.  Have a connective tissue disorder.  Use NSAID medicines. What are the signs or symptoms? Symptoms of this condition include:  Heartburn.  Difficult or painful swallowing.  The feeling of having a lump in the throat.  Abitter taste in the mouth.  Bad breath.  Having a large amount of saliva.  Having an upset or bloated stomach.  Belching.  Chest pain. Different conditions can cause chest pain. Make sure you see your health care provider if you experience chest pain.  Shortness of breath or wheezing.  Ongoing (chronic) cough or a night-time cough.  Wearing away of tooth enamel.  Weight loss. How is this diagnosed? Your health care provider will take a medical history and perform a physical exam. To determine if you have mild or severe GERD, your health care provider may also monitor how you respond to treatment. You may also have tests, including:  A test to examine your stomach and esophagus with a small camera (endoscopy).  A test thatmeasures the acidity level in your esophagus.  A test thatmeasures how much pressure is on your esophagus.  A barium swallow or modified barium swallow test to show the shape, size, and functioning of your esophagus. How is this treated? The goal of treatment is to help relieve your symptoms and to prevent complications. Treatment for this condition may vary depending on  how severe your symptoms are. Your health care provider may recommend:  Changes to your diet.  Medicine.  Surgery. Follow these instructions at home: Eating and drinking   Follow a diet as recommended by your health care provider. This may involve avoiding foods and drinks such as: ? Coffee and tea (with or without caffeine). ? Drinks that containalcohol. ? Energy drinks and sports drinks. ? Carbonated drinks or sodas. ? Chocolate and cocoa. ? Peppermint and mint flavorings. ? Garlic and  onions. ? Horseradish. ? Spicy and acidic foods, including peppers, chili powder, curry powder, vinegar, hot sauces, and barbecue sauce. ? Citrus fruit juices and citrus fruits, such as oranges, lemons, and limes. ? Tomato-based foods, such as red sauce, chili, salsa, and pizza with red sauce. ? Fried and fatty foods, such as donuts, french fries, potato chips, and high-fat dressings. ? High-fat meats, such as hot dogs and fatty cuts of red and white meats, such as rib eye steak, sausage, ham, and bacon. ? High-fat dairy items, such as whole milk, butter, and cream cheese.  Eat small, frequent meals instead of large meals.  Avoid drinking large amounts of liquid with your meals.  Avoid eating meals during the 2-3 hours before bedtime.  Avoid lying down right after you eat.  Do not exercise right after you eat. Lifestyle   Do not use any products that contain nicotine or tobacco, such as cigarettes, e-cigarettes, and chewing tobacco. If you need help quitting, ask your health care provider.  Try to reduce your stress by using methods such as yoga or meditation. If you need help reducing stress, ask your health care provider.  If you are overweight, reduce your weight to an amount that is healthy for you. Ask your health care provider for guidance about a safe weight loss goal. General instructions  Pay attention to any changes in your symptoms.  Take over-the-counter and prescription medicines only as told by your health care provider. Do not take aspirin, ibuprofen, or other NSAIDs unless your health care provider told you to do so.  Wear loose-fitting clothing. Do not wear anything tight around your waist that causes pressure on your abdomen.  Raise (elevate) the head of your bed about 6 inches (15 cm).  Avoid bending over if this makes your symptoms worse.  Keep all follow-up visits as told by your health care provider. This is important. Contact a health care provider  if:  You have: ? New symptoms. ? Unexplained weight loss. ? Difficulty swallowing or it hurts to swallow. ? Wheezing or a persistent cough. ? A hoarse voice.  Your symptoms do not improve with treatment. Get help right away if you:  Have pain in your arms, neck, jaw, teeth, or back.  Feel sweaty, dizzy, or light-headed.  Have chest pain or shortness of breath.  Vomit and your vomit looks like blood or coffee grounds.  Faint.  Have stool that is bloody or black.  Cannot swallow, drink, or eat. Summary  Gastroesophageal reflux happens when acid from the stomach flows up into the esophagus. GERD is a disease in which the reflux happens often, causes frequent or severe symptoms, or causes problems such as damage to the esophagus.  Treatment for this condition may vary depending on how severe your symptoms are. Your health care provider may recommend diet and lifestyle changes, medicine, or surgery.  Contact a health care provider if you have new or worsening symptoms.  Take over-the-counter and prescription medicines only as told  by your health care provider. Do not take aspirin, ibuprofen, or other NSAIDs unless your health care provider told you to do so.  Keep all follow-up visits as told by your health care provider. This is important. This information is not intended to replace advice given to you by your health care provider. Make sure you discuss any questions you have with your health care provider. Document Released: 11/28/2004 Document Revised: 08/27/2017 Document Reviewed: 08/27/2017 Elsevier Interactive Patient Education  2019 [employer].

## 2018-04-27 NOTE — Progress Notes (Signed)
cc'd to pcp 

## 2018-04-27 NOTE — Progress Notes (Addendum)
REVIEWED-[facility] EGD/POSSIBLE DIL AFTER JUN 1 DUE TO COVID 19 RESTRICTIONS.  Primary Care Physician:  Glenda Chroman, MD  Primary Gastroenterologist:  Barney Drain, MD   Chief Complaint  Patient presents with  . Gastroesophageal Reflux    taking pepcid and it seems to be doing okay  . Abdominal Pain    comes/goes below naval    HPI:  Geoffrey [name] is a 63 y.o. male here at the request of Dr. fields for further evaluation of reflux and abdominal pain.  Patient reports having reflux for years.  He has been on various medications including omeprazole, Nexium, pantoprazole.  Often switching these medications after he is heard or read about potential side effects.  Most recently was on pantoprazole 40 mg daily.  He took it for about 4 months but then felt like he should try to come off of it due to potential side effects.  Currently is taking Pepcid Complete once daily.  Heartburn overall doing okay right now.  He has had some issues with nocturnal reflux.  Some vague solid food dysphagia associated with regurgitation.  No nausea or vomiting.  He has upper abdominal pain from the epigastrium down to the umbilicus.  This is been going on for several years.  He reports having a CT scan in [place] a couple of years ago which was unremarkable.  Patient is never had an upper endoscopy.  Bowel movements are regular.  No blood in the stool or melena.  Patient takes diclofenac 2-3 times a week for osteoarthritis.  He has been on it for a couple of years.  He takes Xanax about once or twice a week to help sleep.  Last colonoscopy about 4 years ago by Dr. Anthony Sar.  Patient reports good sedation.  He describes conscious sedation.  States his exam was normal except for hemorrhoids.  He has had several colonoscopies.  Denies any history of polyps.     Current Outpatient Medications  Medication Sig Dispense Refill  . ALPRAZolam (XANAX) 0.5 MG tablet Take 0.5 mg by mouth 3 (three) times daily as needed for  sleep or anxiety.    . diclofenac (VOLTAREN) 75 MG EC tablet Take 75 mg by mouth as needed.    . famotidine (PEPCID) 10 MG tablet Take 10 mg by mouth daily.    . fish oil-omega-3 fatty acids 1000 MG capsule Take 2 g by mouth daily. Reported on 05/29/2015    . Lactobacillus (ACIDOPHILUS PROBIOTIC PO) Take 2 capsules by mouth daily.    . Multiple Vitamin (MULTIVITAMIN WITH MINERALS) TABS Take 1 tablet by mouth daily.    . rosuvastatin (CRESTOR) 5 MG tablet Take 5 mg by mouth daily.     No current facility-administered medications for this visit.     Allergies as of 04/27/2018  . (No Known Allergies)    Past Medical History:  Diagnosis Date  . GERD (gastroesophageal reflux disease)   . Hypercholesteremia   . Osteoarthritis   . Varicose veins     Past Surgical History:  Procedure Laterality Date  . CARDIAC CATHETERIZATION     "came back good"  . COLONOSCOPY  2016   Per patient, Dr. Anthony Sar, normal  . HERNIA REPAIR  1993   Dr. Lindalou Hose: umbilical hernia  . KNEE SURGERY Right    arthroscopy  . VARICOSE VEIN SURGERY Right     Family History  Problem Relation Age of Onset  . Breast cancer Mother   . Alzheimer's disease Mother   .  Diabetes Mother   . Dementia Father   . Diabetes Brother   . Colon cancer Neg Hx     Social History   Socioeconomic History  . Marital status: Married    Spouse name: Not on file  . Number of children: Not on file  . Years of education: Not on file  . Highest education level: Not on file  Occupational History  . Not on file  Social Needs  . Financial resource strain: Not on file  . Food insecurity:    Worry: Not on file    Inability: Not on file  . Transportation needs:    Medical: Not on file    Non-medical: Not on file  Tobacco Use  . Smoking status: Never Smoker  . Smokeless tobacco: Never Used  Substance and Sexual Activity  . Alcohol use: Yes    Alcohol/week: 0.0 standard drinks    Comment: occassional  . Drug use: No  .  Sexual activity: Not on file  Lifestyle  . Physical activity:    Days per week: Not on file    Minutes per session: Not on file  . Stress: Not on file  Relationships  . Social connections:    Talks on phone: Not on file    Gets together: Not on file    Attends religious service: Not on file    Active member of club or organization: Not on file    Attends meetings of clubs or organizations: Not on file    Relationship status: Not on file  . Intimate partner violence:    Fear of current or ex partner: Not on file    Emotionally abused: Not on file    Physically abused: Not on file    Forced sexual activity: Not on file  Other Topics Concern  . Not on file  Social History Narrative  . Not on file      ROS:  General: Negative for anorexia, weight loss, fever, chills, fatigue, weakness. Eyes: Negative for vision changes.  ENT: Negative for hoarseness,   nasal congestion.  See HPI CV: Negative for chest pain, angina, palpitations, dyspnea on exertion, peripheral edema.  Respiratory: Negative for dyspnea at rest, dyspnea on exertion, cough, sputum, wheezing.  GI: See history of present illness. GU:  Negative for dysuria, hematuria, urinary incontinence, urinary frequency, nocturnal urination.  MS: Negative for  low back pain.  Positive joint pain Derm: Negative for rash or itching.  Neuro: Negative for weakness, abnormal sensation, seizure, frequent headaches, memory loss, confusion.  Psych: Negative for anxiety, depression, suicidal ideation, hallucinations.  Endo: Negative for unusual weight change.  Heme: Negative for bruising or bleeding. Allergy: Negative for rash or hives.    Physical Examination:  BP 132/78   Pulse 77   Temp (!) 97 F (36.1 C) (Oral)   Ht 6\' 2"  (1.88 m)   Wt 250 lb 6.4 oz (113.6 kg)   BMI 32.15 kg/m    General: Well-nourished, well-developed in no acute distress.  Head: Normocephalic, atraumatic.   Eyes: Conjunctiva pink, no icterus. Mouth:  Oropharyngeal mucosa moist and pink , no lesions erythema or exudate. Neck: Supple without thyromegaly, masses, or lymphadenopathy.  Lungs: Clear to auscultation bilaterally.  Heart: Regular rate and rhythm, no murmurs rubs or gallops.  Abdomen: Bowel sounds are normal,  nondistended, no hepatosplenomegaly or masses, no abdominal bruits, no rebound.   Central abdomen very sensitive to light touch.  No obvious herniation.  He has significant rectus diastases.  Prominent blood vessels noted. Rectal:not  Performed Extremities: No lower extremity edema. No clubbing or deformities.  Neuro: Alert and oriented x 4 , grossly normal neurologically.  Skin: Warm and dry, no rash or jaundice.   Psych: Alert and cooperative, normal mood and affect.  Labs: Labs from 02/03/2018 White blood cell count 6100, hemoglobin 15.8, hematocrit 46.6, platelets 181,000, TSH 2.190, BUN 10, creatinine 0.9, sodium 138, potassium 4, albumin 4.4, total bilirubin 1, alkaline phosphatase 63, AST 32, ALT 40, PSA 0.6  Imaging Studies: No results found.

## 2018-04-28 ENCOUNTER — Other Ambulatory Visit: Payer: Self-pay

## 2018-04-28 ENCOUNTER — Telehealth: Payer: Self-pay

## 2018-04-28 DIAGNOSIS — R1013 Epigastric pain: Secondary | ICD-10-CM

## 2018-04-28 DIAGNOSIS — R1310 Dysphagia, unspecified: Secondary | ICD-10-CM

## 2018-04-28 DIAGNOSIS — R1319 Other dysphagia: Secondary | ICD-10-CM

## 2018-04-28 DIAGNOSIS — K219 Gastro-esophageal reflux disease without esophagitis: Secondary | ICD-10-CM

## 2018-04-28 NOTE — Telephone Encounter (Signed)
Called pt, offered to schedule EGD/-/+DIL w/[facility] 05/06/18 d/t we had a cancellation. He will call his wife to check with her and call office back.

## 2018-04-28 NOTE — Telephone Encounter (Signed)
Pt called office, procedure scheduled for 05/06/18 at 8:30am. Instructions given on phone and mailed. Orders entered.

## 2018-05-01 ENCOUNTER — Telehealth: Payer: Self-pay | Admitting: Gastroenterology

## 2018-05-01 ENCOUNTER — Encounter: Payer: Self-pay | Admitting: Gastroenterology

## 2018-05-01 NOTE — Telephone Encounter (Signed)
CT abdomen pelvis with contrast dated 12/18/2016 performed for left lower quadrant pain.  Diffuse hepatic steatosis.  Tiny fat-containing bilateral inguinal hernias.  Small 1.3 cm right lower pole renal cyst.  Otherwise unremarkable study.  Colonoscopy, September 2016 by Dr. Burke Keels, Versed 6 mg/Demerol 75 mg.  Prep was good.  Normal exam.  Next colonoscopy 10 years.  Please let patient know we have reviewed records and  PLEASE NIC FOR TCS IN 03/2024.

## 2018-05-04 NOTE — Telephone Encounter (Signed)
ON RECALL  °

## 2018-05-04 NOTE — Telephone Encounter (Signed)
Noted. Pt also had questions about his EGD this Wednesday. Went over instructions per letter that was mailed to pt.   Please NIC TCS  03/2024

## 2018-05-05 ENCOUNTER — Telehealth: Payer: Self-pay | Admitting: Gastroenterology

## 2018-05-05 NOTE — Telephone Encounter (Signed)
[phone] OR [phone] PLEASE CALL PATIENT, HE NEEDS TO RESCHEDULE HIS PROCEDURE

## 2018-05-05 NOTE — Telephone Encounter (Signed)
Called pt, his son is in the hospital with pneumonia. EGD/-/+DIL w/[facility] rescheduled to 07/20/18 at 8:30am. LMOVM for endo scheduler. New instructions mailed.

## 2018-06-29 ENCOUNTER — Telehealth: Payer: Self-pay | Admitting: *Deleted

## 2018-06-29 NOTE — Telephone Encounter (Addendum)
Per [facility] r/s procedure after 6/1. Called patient and he is scheduled for 7/13 at 12:15pm. Patient aware will mail new instructions (confirmed address). Called endo and LMOVM making aware.

## 2018-09-01 ENCOUNTER — Telehealth: Payer: Self-pay | Admitting: *Deleted

## 2018-09-01 ENCOUNTER — Telehealth: Payer: Self-pay | Admitting: Gastroenterology

## 2018-09-01 NOTE — Telephone Encounter (Signed)
REVIEWED-NO ADDITIONAL RECOMMENDATIONS. 

## 2018-09-01 NOTE — Telephone Encounter (Signed)
Pt called to say he wanted to cancel his COVID test and his procedure with [facility] on 7/13. He wants to wait 3-4 months and follow up with [facility]. I have put him on the recall list with [facility] for OCT 2020. [phone]

## 2018-09-01 NOTE — Telephone Encounter (Signed)
Called endo and procedure has been cancelled. FYI to [facility]

## 2018-09-01 NOTE — Telephone Encounter (Signed)
LMOVM for pt 

## 2018-09-01 NOTE — Telephone Encounter (Signed)
Spoke to pt, COVID test scheduled for 09/10/18 at 3:00pm. Pt is aware he will need to quarantine at home after test until procedure. He requested appt letter be sent to him. Appt letter mailed.

## 2018-09-10 ENCOUNTER — Other Ambulatory Visit (HOSPITAL_COMMUNITY): Payer: BC Managed Care – PPO

## 2018-09-14 ENCOUNTER — Encounter (HOSPITAL_COMMUNITY): Payer: Self-pay

## 2018-09-14 ENCOUNTER — Ambulatory Visit (HOSPITAL_COMMUNITY): Admit: 2018-09-14 | Payer: BC Managed Care – PPO | Admitting: Gastroenterology

## 2018-09-14 SURGERY — EGD (ESOPHAGOGASTRODUODENOSCOPY)
Anesthesia: Moderate Sedation

## 2018-12-01 ENCOUNTER — Encounter: Payer: Self-pay | Admitting: Gastroenterology

## 2018-12-11 ENCOUNTER — Other Ambulatory Visit: Payer: Self-pay

## 2018-12-11 DIAGNOSIS — Z20822 Contact with and (suspected) exposure to covid-19: Secondary | ICD-10-CM

## 2018-12-13 LAB — NOVEL CORONAVIRUS, NAA: SARS-CoV-2, NAA: NOT DETECTED

## 2019-01-19 ENCOUNTER — Other Ambulatory Visit: Payer: Self-pay

## 2019-01-19 DIAGNOSIS — I83891 Varicose veins of right lower extremities with other complications: Secondary | ICD-10-CM

## 2019-01-22 ENCOUNTER — Encounter: Payer: Self-pay | Admitting: Family

## 2019-01-22 ENCOUNTER — Ambulatory Visit: Payer: BC Managed Care – PPO | Admitting: Family

## 2019-01-22 ENCOUNTER — Other Ambulatory Visit: Payer: Self-pay

## 2019-01-22 ENCOUNTER — Ambulatory Visit (HOSPITAL_COMMUNITY)
Admission: RE | Admit: 2019-01-22 | Discharge: 2019-01-22 | Disposition: A | Discharge status: Home / Self Care | Payer: BC Managed Care – PPO | Source: Ambulatory Visit | Attending: Family | Admitting: Family

## 2019-01-22 VITALS — BP 119/81 | HR 87 | Temp 97.5°F | Resp 18 | Ht 74.0 in | Wt 239.0 lb

## 2019-01-22 DIAGNOSIS — I824Z1 Acute embolism and thrombosis of unspecified deep veins of right distal lower extremity: Secondary | ICD-10-CM | POA: Diagnosis not present

## 2019-01-22 DIAGNOSIS — I83891 Varicose veins of right lower extremities with other complications: Secondary | ICD-10-CM | POA: Diagnosis not present

## 2019-01-22 DIAGNOSIS — I83893 Varicose veins of bilateral lower extremities with other complications: Secondary | ICD-10-CM | POA: Diagnosis not present

## 2019-01-22 MED ORDER — RIVAROXABAN (XARELTO) VTE STARTER PACK (15 & 20 MG): ORAL_TABLET | ORAL | 0 refills | Status: DC

## 2019-01-22 NOTE — Progress Notes (Signed)
Referred by:  Glenda Chroman, MD [place],  [place] [address]  Reason for referral: bleeding from superficial varicosity of right lower leg  History of Present Illness  Geoffrey [name] is a 63 y.o. (August 22, 1955) male who presents with chief complaint: bleeding from superficial varicosity of right lower leg 1-2 weeks ago. He reports that about 1-2 weeks ago his small dog scratched his right leg, and he bled a great deal from this. He applied a topical agent to stop the bleeding, applied bandage. No more bleeding until a few days later taking a hot shower, states blood shot out, stopped with direct pressure and dressing.   He has a history of right GSV ablation in 2017 by Dr. Kellie Simmering. Pt also had previous laser ablation of the right small saphenous vein in 2012 by Dr. Kellie Simmering. He has also had sclerotherapy in the right leg.  He has never had venous procedures in the left leg.  He worked in a prison for 26 years, on his feet most of the time.   He denies any history of DVT, but does have a history of phlebitis at his right posterior knee area.  He states that his mother has a history of phlebitis.   He has not used thigh high compression hose since he saw Dr. Kellie Simmering. He also wore them at work, he retired in 2017.  He states he has some swelling in his right lower leg if he stands for long periods.   He states that Dr. Percell Miller is planning on a right hip replacement 03-18-19, then right knee replacement, then left hip and knee surgery.   Pt denies taking any blood thinners.   He takes a statin, does not any ASA. He takes ibuprofen and tylenol for arthritis pain.  He denies having DM.  He denies any use of tobacco.   Past Medical History:  Diagnosis Date  . GERD (gastroesophageal reflux disease)   . Hypercholesteremia   . Osteoarthritis   . Varicose veins     Past Surgical History:  Procedure Laterality Date  . CARDIAC CATHETERIZATION     "came back good"  . COLONOSCOPY  2016    11/2014: Dr. Burke Keels, Versed 6mg /Demerol 75mg . normal exam. prep good. next tcs in 10 years.   Marland Kitchen HERNIA REPAIR  1993   Dr. Lindalou Hose: umbilical hernia  . KNEE SURGERY Right    arthroscopy  . VARICOSE VEIN SURGERY Right     Social History   Socioeconomic History  . Marital status: Married    Spouse name: Not on file  . Number of children: Not on file  . Years of education: Not on file  . Highest education level: Not on file  Occupational History  . Not on file  Social Needs  . Financial resource strain: Not on file  . Food insecurity    Worry: Not on file    Inability: Not on file  . Transportation needs    Medical: Not on file    Non-medical: Not on file  Tobacco Use  . Smoking status: Never Smoker  . Smokeless tobacco: Never Used  Substance and Sexual Activity  . Alcohol use: Yes    Alcohol/week: 0.0 standard drinks    Comment: occassional  . Drug use: No  . Sexual activity: Not on file  Lifestyle  . Physical activity    Days per week: Not on file    Minutes per session: Not on file  . Stress: Not  on file  Relationships  . Social Herbalist on phone: Not on file    Gets together: Not on file    Attends religious service: Not on file    Active member of club or organization: Not on file    Attends meetings of clubs or organizations: Not on file    Relationship status: Not on file  . Intimate partner violence    Fear of current or ex partner: Not on file    Emotionally abused: Not on file    Physically abused: Not on file    Forced sexual activity: Not on file  Other Topics Concern  . Not on file  Social History Narrative  . Not on file    Family History  Problem Relation Age of Onset  . Breast cancer Mother   . Alzheimer's disease Mother   . Diabetes Mother   . Dementia Father   . Diabetes Brother   . Colon cancer Neg Hx     Current Outpatient Medications on File Prior to Visit  Medication Sig Dispense Refill  . acetaminophen  (TYLENOL) 500 MG tablet Take 500 mg by mouth daily as needed for moderate pain.    Marland Kitchen ALPRAZolam (XANAX) 0.5 MG tablet Take 0.5 mg by mouth at bedtime as needed for anxiety or sleep.     Marland Kitchen ibuprofen (ADVIL,MOTRIN) 200 MG tablet Take 200 mg by mouth daily as needed for moderate pain.    . Multiple Vitamin (MULTIVITAMIN WITH MINERALS) TABS Take 1 tablet by mouth daily.    . pantoprazole (PROTONIX) 40 MG tablet Take 1 tablet (40 mg total) by mouth daily before breakfast.    . rosuvastatin (CRESTOR) 5 MG tablet Take 5 mg by mouth daily.    . traMADol (ULTRAM) 50 MG tablet Take 50 mg by mouth daily as needed.    . diclofenac (VOLTAREN) 75 MG EC tablet Take 75 mg by mouth daily as needed (arthritis).     . famotidine (PEPCID) 10 MG tablet Take 10 mg by mouth at bedtime as needed for heartburn or indigestion.    . fish oil-omega-3 fatty acids 1000 MG capsule Take 1 g by mouth daily.     . Lactobacillus (ACIDOPHILUS PROBIOTIC PO) Take 2 capsules by mouth daily.    Marland Kitchen oxymetazoline (AFRIN) 0.05 % nasal spray Place 1 spray into both nostrils 2 (two) times daily as needed for congestion.    Marland Kitchen PREVIDENT 5000 BOOSTER PLUS 1.1 % PSTE Apply 1 application topically 2 (two) times a week.    . Turmeric Curcumin 500 MG CAPS Take 500 mg by mouth 3 (three) times a week.     No current facility-administered medications on file prior to visit.     No Known Allergies  REVIEW OF SYSTEMS: Cardiovascular: No chest pain, chest pressure, palpitations, orthopnea, or dyspnea on exertion. No claudication or rest pain,  No history of DVT,  + phlebitis in right popliteal space x2 different times Pulmonary: No productive cough, asthma or wheezing. Neurologic: No weakness, paresthesias, aphasia, or amaurosis. No dizziness. Hematologic: No bleeding problems or clotting disorders. Musculoskeletal: + multiple joint pain, occasional left knee joint swelling. Gastrointestinal: No blood in stool or hematemesis Genitourinary: No  dysuria or hematuria. Psychiatric:: No history of major depression. Integumentary: No rashes or ulcers. Constitutional: No fever or chills.  Physical Examination Vitals:   01/22/19 1349  BP: 119/81  Pulse: 87  Resp: 18  Temp: (!) 97.5 F (36.4 C)  TempSrc: Temporal  SpO2: 97%  Weight: 239 lb (108.4 kg)  Height: 6\' 2"  (1.88 m)   Body mass index is 30.69 kg/m.  PHYSICAL EXAMINATION: General: obese male appears older than his stated age  7:  No gross abnormalities Pulmonary: Respirations are non-labored, fair air movement in all fields, no rales, rhonchi, or wheezes Abdomen: Soft and non-tender with normal bowel sounds. Musculoskeletal: There are no major deformities.   Neurologic: No focal weakness or paresthesias are detected, muscle strength: 5/5 in both upper extremities, 2-3/5 in right LE, 4/5 in left LE.  Skin: There are no ulcer or rashes noted. Hemosiderin staining in gaiter pattern right lower leg Psychiatric: The patient has normal affect. Cardiovascular: There is a regular rate and rhythm without significant murmur appreciated.  Multiples small varicosities in both lower legs 1+ right pretibial pitting edema. Trace pitting edema in left lower leg.   Vascular: Vessel Right Left  Radial 2+Palpable 2+Palpable  Carotid  without bruit  without bruit  Aorta Not palpable N/A  Femoral Not Palpable (too tender to palpate) 2+Palpable  Popliteal Not palpable 1+ palpable  PT 2+Palpable Not Palpable  DP 2+Palpable 2+Palpable    Non-Invasive Vascular Imaging  Right LE Venous Duplex (Date: 01/22/2019):  +---------+---------------+---------+-----------+----------+--------------+ RIGHT    CompressibilityPhasicitySpontaneityPropertiesThrombus Aging +---------+---------------+---------+-----------+----------+--------------+ CFV      Partial        Yes      Yes                                  +---------+---------------+---------+-----------+----------+--------------+ SFJ      Full                    Yes                                 +---------+---------------+---------+-----------+----------+--------------+ FV Mid   Partial        Yes      Yes                  Acute          +---------+---------------+---------+-----------+----------+--------------+ FV DistalPartial        No       No                   Acute          +---------+---------------+---------+-----------+----------+--------------+ POP      None           Yes      Yes                  Acute          +---------+---------------+---------+-----------+----------+--------------+ PTV      Full                    Yes                                 +---------+---------------+---------+-----------+----------+--------------+ PERO     Full                    Yes                                 +---------+---------------+---------+-----------+----------+--------------+  Venous Reflux  Times Normal value < 0.5 sec +---------+------+---------+--------+--------------+ RIGHT    RefluxReflux NoDiameterComments                 Yes                                  +---------+------+---------+--------+--------------+ CFV       yes                   2420 ms        +---------+------+---------+--------+--------------+ FV mid                          acute thrombus +---------+------+---------+--------+--------------+ FV dist                         acute thrombus +---------+------+---------+--------+--------------+ Popliteal                       acute thrombus +---------+------+---------+--------+--------------+ Summary: Right: Femoral and popliteal vein acute thrombus noted while performing reflux exam. Reflux noted in the common femoral vein. Reflux exam terminated when DVT was discovered.   Outside Studies/Documentation 6 pages of outside documents  were reviewed referral note   Medical Decision Making  Geoffrey [name] is a 63 y.o. male who presents with: asymptomatic right femoral and popliteal vein acute thrombus. He was referred for bleeding from superficial varicosity at his right lower leg a week or 2 ago. Bleeding has stopped.  He has a history of right GSV and small saphenous vein ablations in 2017 and 2012 by Dr. Kellie Simmering.  I spoke with Dr. Donzetta Matters, start Xarelto starter pack tonight (prescription sent to his pharmacy), refer pt to his PCP for DVT management. Needs to see his PCP ASAP.  Re right hip surgery planned for 03-18-19 by Dr. Percell Miller: need to postpone or schedule pre op IVC filter placement.  Gentle knee hight compression hose (15-20 mm Hg graduated pressure), start wearing tomorrow.     Clemon Chambers, RN, MSN, FNP-C [facility] Office: [phone]  01/22/2019, 2:05 PM  Clinic MD: Donzetta Matters

## 2019-01-22 NOTE — Patient Instructions (Signed)
   To decrease swelling in your feet and legs: Elevate feet above slightly bent knees, feet above heart, overnight and 3-4 times per day for 20 minutes.   

## 2019-03-22 ENCOUNTER — Other Ambulatory Visit: Payer: Self-pay

## 2019-03-22 ENCOUNTER — Ambulatory Visit: Payer: BC Managed Care – PPO | Attending: Internal Medicine

## 2019-03-22 DIAGNOSIS — Z20822 Contact with and (suspected) exposure to covid-19: Secondary | ICD-10-CM

## 2019-03-23 LAB — NOVEL CORONAVIRUS, NAA: SARS-CoV-2, NAA: NOT DETECTED

## 2019-03-24 ENCOUNTER — Telehealth: Payer: Self-pay | Admitting: *Deleted

## 2019-03-24 NOTE — Telephone Encounter (Signed)
Pt calling for covid results, negative, verbalizes understanding. 

## 2019-10-13 ENCOUNTER — Other Ambulatory Visit: Payer: Self-pay

## 2019-10-13 DIAGNOSIS — I83893 Varicose veins of bilateral lower extremities with other complications: Secondary | ICD-10-CM

## 2019-10-28 ENCOUNTER — Ambulatory Visit (HOSPITAL_COMMUNITY)
Admission: RE | Admit: 2019-10-28 | Discharge: 2019-10-28 | Disposition: A | Discharge status: Home / Self Care | Payer: BC Managed Care – PPO | Source: Ambulatory Visit | Attending: Internal Medicine | Admitting: Internal Medicine

## 2019-10-28 ENCOUNTER — Other Ambulatory Visit: Payer: Self-pay

## 2019-10-28 ENCOUNTER — Ambulatory Visit (INDEPENDENT_AMBULATORY_CARE_PROVIDER_SITE_OTHER): Payer: BC Managed Care – PPO | Admitting: Physician Assistant

## 2019-10-28 VITALS — BP 136/88 | HR 70 | Temp 98.8°F | Resp 20 | Ht 74.0 in | Wt 245.5 lb

## 2019-10-28 DIAGNOSIS — M7989 Other specified soft tissue disorders: Secondary | ICD-10-CM

## 2019-10-28 DIAGNOSIS — I83893 Varicose veins of bilateral lower extremities with other complications: Secondary | ICD-10-CM

## 2019-10-28 NOTE — Progress Notes (Signed)
Office Note     CC:  follow up Requesting Provider:  Glenda Chroman, MD  HPI: Geoffrey [name] is a 64 y.o. (1955-06-30) male who presents for follow up of chronic venous insufficiency and hx of DVT. He was last seen in November of 2020. At the time he had some bleeding from a superficial varicosity of his right leg following a small dog scratch. Pressure hemostasis was obtained. He did have recurrence several days later but this was also stopped with pressure. On duplex exam at the time he was found to have acute femoral and popliteal thrombus. He was started on Xarelto. He was encouraged to continue light knee high compression as well.  He has a history of right GSV ablation in 2017 and previous laser ablation of the right small saphenous vein in 2012 by Dr. Kellie Simmering. He has also had sclerotherapy on the right leg for bleeding varicose veins.  He has never had venous procedures in the left leg.  He presents today with ulceration of his right lower leg. This has been present for approximately 1.5 months. He said it just appeared on its own. Does not report andy trauma to his leg.  He has tried salt, tumeric and Neosporin to try to get it to heal. He intermittently has been wearing compression stockings. He says that he feels his swelling is worse with them on and that they cut into his thigh. He does have pair of knee high with zipper which he can tolerate better but he admits that he does not wear these frequently.  He was treated on Xarelto for acute RLE DVT for 6 months. He stopped the Xarelto prior to his right hip replacement surgery.  Of note he had right hip replacement in May 2021 and Right knee replacement is scheduled for November pending resolution of his right leg ulceration. He is also anticipating a left hip and knee in the next couple of months Past Medical History:  Diagnosis Date  . GERD (gastroesophageal reflux disease)   . Hypercholesteremia   . Osteoarthritis   . Varicose veins      Past Surgical History:  Procedure Laterality Date  . CARDIAC CATHETERIZATION     "came back good"  . COLONOSCOPY  2016   11/2014: Dr. Burke Keels, Versed 6mg /Demerol 75mg . normal exam. prep good. next tcs in 10 years.   Marland Kitchen HERNIA REPAIR  1993   Dr. Lindalou Hose: umbilical hernia  . KNEE SURGERY Right    arthroscopy  . VARICOSE VEIN SURGERY Right     Social History   Socioeconomic History  . Marital status: Married    Spouse name: Not on file  . Number of children: Not on file  . Years of education: Not on file  . Highest education level: Not on file  Occupational History  . Not on file  Tobacco Use  . Smoking status: Never Smoker  . Smokeless tobacco: Never Used  Substance and Sexual Activity  . Alcohol use: Yes    Alcohol/week: 0.0 standard drinks    Comment: occassional  . Drug use: No  . Sexual activity: Not on file  Other Topics Concern  . Not on file  Social History Narrative  . Not on file   Social Determinants of Health   Financial Resource Strain:   . Difficulty of Paying Living Expenses: Not on file  Food Insecurity:   . Worried About Charity fundraiser in the Last Year: Not on file  . Ran Out  of Food in the Last Year: Not on file  Transportation Needs:   . Lack of Transportation (Medical): Not on file  . Lack of Transportation (Non-Medical): Not on file  Physical Activity:   . Days of Exercise per Week: Not on file  . Minutes of Exercise per Session: Not on file  Stress:   . Feeling of Stress : Not on file  Social Connections:   . Frequency of Communication with Friends and Family: Not on file  . Frequency of Social Gatherings with Friends and Family: Not on file  . Attends Religious Services: Not on file  . Active Member of Clubs or Organizations: Not on file  . Attends Archivist Meetings: Not on file  . Marital Status: Not on file  Intimate Partner Violence:   . Fear of Current or Ex-Partner: Not on file  . Emotionally Abused: Not  on file  . Physically Abused: Not on file  . Sexually Abused: Not on file    Family History  Problem Relation Age of Onset  . Breast cancer Mother   . Alzheimer's disease Mother   . Diabetes Mother   . Dementia Father   . Diabetes Brother   . Colon cancer Neg Hx     Current Outpatient Medications  Medication Sig Dispense Refill  . acetaminophen (TYLENOL) 500 MG tablet Take 500 mg by mouth daily as needed for moderate pain.    Marland Kitchen diclofenac (VOLTAREN) 75 MG EC tablet Take 75 mg by mouth 2 (two) times daily as needed.    . fluorouracil (EFUDEX) 5 % cream Apply topically 2 (two) times daily as needed.    . pantoprazole (PROTONIX) 40 MG tablet Take 1 tablet (40 mg total) by mouth daily before breakfast.    . rosuvastatin (CRESTOR) 5 MG tablet Take 5 mg by mouth daily.    . Turmeric Curcumin 500 MG CAPS Take 500 mg by mouth 3 (three) times a week.     No current facility-administered medications for this visit.    No Known Allergies   REVIEW OF SYSTEMS:  [X]  denotes positive finding, [ ]  denotes negative finding Cardiac  Comments:  Chest pain or chest pressure:    Shortness of breath upon exertion:    Short of breath when lying flat:    Irregular heart rhythm:        Vascular    Pain in calf, thigh, or hip brought on by ambulation:    Pain in feet at night that wakes you up from your sleep:     Blood clot in your veins:    Leg swelling:         Pulmonary    Oxygen at home:    Productive cough:     Wheezing:         Neurologic    Sudden weakness in arms or legs:     Sudden numbness in arms or legs:     Sudden onset of difficulty speaking or slurred speech:    Temporary loss of vision in one eye:     Problems with dizziness:         Gastrointestinal    Blood in stool:     Vomited blood:         Genitourinary    Burning when urinating:     Blood in urine:        Psychiatric    Major depression:         Hematologic    Bleeding  problems:    Problems with  blood clotting too easily:        Skin    Rashes or ulcers:        Constitutional    Fever or chills:      PHYSICAL EXAMINATION:  Vitals:   10/28/19 1323  BP: 136/88  Pulse: 70  Resp: 20  Temp: 98.8 F (37.1 C)  TempSrc: Temporal  SpO2: 98%  Weight: 245 lb 8 oz (111.4 kg)  Height: 6\' 2"  (1.88 m)    General:  WDWN in NAD; vital signs documented above Gait: Notmal HENT: WNL, normocephalic Pulmonary: normal non-labored breathing , without wheezing Cardiac: regular HR Abdomen: soft, NT, no masses Vascular Exam/Pulses: 2+ DP and PT pulses bilaterally, bilateral lower extremities well perfused and warm Extremities: without ischemic changes, without Gangrene , without cellulitis; with open wound of the medial distal right leg 1 cm x 1 cm superficial ulceration. No drainage. No bleeding. There are some small reticular veins that surround this ulcer as pictured below   Musculoskeletal: no muscle wasting or atrophy  Neurologic: A&O X 3;  No focal weakness or paresthesias are detected Psychiatric:  The pt has Normal affect.   Non-Invasive Vascular Imaging:   Venous Reflux Times  +--------------+---------+------+-----------+------------+-----------------  ----+  RIGHT     Reflux NoRefluxReflux TimeDiameter cmsComments                      Yes                          +--------------+---------+------+-----------+------------+-----------------  ----+  CFV            yes  >1 second                    +--------------+---------+------+-----------+------------+-----------------  ----+  FV mid          yes  >1 second                    +--------------+---------+------+-----------+------------+-----------------  ----+  Popliteal         yes  >1 second                      +--------------+---------+------+-----------+------------+-----------------  ----+  GSV at SFJ        yes  >500 ms   0.84               +--------------+---------+------+-----------+------------+-----------------  ----+  GSV prox thigh                    NV            +--------------+---------+------+-----------+------------+-----------------  ----+  GSV mid thigh                     NV            +--------------+---------+------+-----------+------------+-----------------  ----+  GSV dist thigh                    NV            +--------------+---------+------+-----------+------------+-----------------  ----+  GSV at knee                      NV            +--------------+---------+------+-----------+------------+-----------------  ----+  GSV prox calf  0.33               +--------------+---------+------+-----------+------------+-----------------  ----+  GSV mid calf                 0.37               +--------------+---------+------+-----------+------------+-----------------  ----+  GSV dist calf          >500 ms   0.66  direct  connection to                              popliteal         +--------------+---------+------+-----------+------------+-----------------  ----+  SSV Pop Fossa          >500 ms       ablated tortuous     +--------------+---------+------+-----------+------------+-----------------  ----+  SSV prox calf                     NV            +--------------+---------+------+-----------+------------+-----------------  ----+  SSV mid calf                 0.42                +--------------+---------+------+-----------+------------+-----------------  ----+                         0.36               +--------------+---------+------+-----------+------------+-----------------     ASSESSMENT/PLAN:: 64 y.o. male here for follow up for his chronic venous insufficiency. He has a new RLE ulceration.  Right lower extremity venous reflux duplex today showing deep reflux in CFV, FV and popliteal vein. His GSV and SSV have been ablated. He does have some superficial reflux in the GSV and SSV. No acute DVT - Based on the findings of his duplex today and prior interventions he is not a candidate for further venous procedures due to location of his superficial reflux he would be at increased risk of DVT if these areas were treated due to connection of the deep and superficial systems - He is candidate for sclerotherapy of the small reticular veins that are feeding into area of ulceration however I discussed cost of sclerotherapy with patient and also Barbaraann Rondo and would likely require 2 units of sclerosant which would be $500. Both patient and Izora Gala are going to check with patients insurance to see if this is something covered. At this time patient would like to hold off on scheduling - I have had him measured for some knee high compression stockings which I have encouraged him to use on daily bases. I did discuss short term application of Unna boot but he does not want this at this time. - Instructed him to clean wound with mild soap and water. He may keep it covered while he has compression stockings on but did recommend allowing it to be open to air at some point during the day - Recommend continued compression stocking use, exercise therapy, and elevation - He will follow up as needed. He will call to schedule sclerotherapy in the next couple weeks if he has no improvement with above recommendations   Karoline Caldwell,  PA-C [facility] [phone]  Clinic MD:  Dr. Oneida Alar

## 2020-01-25 ENCOUNTER — Ambulatory Visit (HOSPITAL_COMMUNITY)
Admission: RE | Admit: 2020-01-25 | Discharge: 2020-01-25 | Disposition: A | Discharge status: Home / Self Care | Payer: BC Managed Care – PPO | Source: Ambulatory Visit | Attending: Physician Assistant | Admitting: Physician Assistant

## 2020-01-25 ENCOUNTER — Other Ambulatory Visit (HOSPITAL_COMMUNITY): Payer: Self-pay | Admitting: Physician Assistant

## 2020-01-25 ENCOUNTER — Other Ambulatory Visit: Payer: Self-pay

## 2020-01-25 ENCOUNTER — Other Ambulatory Visit: Payer: Self-pay | Admitting: Physician Assistant

## 2020-01-25 DIAGNOSIS — M7989 Other specified soft tissue disorders: Secondary | ICD-10-CM

## 2020-01-25 DIAGNOSIS — M79604 Pain in right leg: Secondary | ICD-10-CM | POA: Insufficient documentation

## 2020-04-27 ENCOUNTER — Ambulatory Visit: Payer: BC Managed Care – PPO | Admitting: Orthopedic Surgery

## 2020-04-27 DIAGNOSIS — I83891 Varicose veins of right lower extremities with other complications: Secondary | ICD-10-CM | POA: Diagnosis not present

## 2020-04-28 ENCOUNTER — Encounter: Payer: Self-pay | Admitting: Orthopedic Surgery

## 2020-04-28 NOTE — Progress Notes (Signed)
Office Visit Note   Patient: Geoffrey [name]           Date of Birth: [birth year]           MRN: [id number] Visit Date: 04/27/2020              Requested by: Glenda Chroman, MD [address] [place],  [place] [address] PCP: Glenda Chroman, MD  Chief Complaint  Patient presents with  . Right Leg - Open Wound      HPI: Patient is a 65 year old gentleman who is seen for initial evaluation for ulceration right lower extremity.  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  Patient does have venous insufficiency he is currently using a quad cane he is on Keflex 500 mg 4 times a day.  Assessment & Plan: Visit Diagnoses:  1. Varicose veins of right lower extremity with complications     Plan: Recommended a knee-high compression stocking size extra-large and then advance to a size large.  A 2 extra-large was placed on his leg in the office.  Follow-Up Instructions: Return in about 2 weeks (around 05/11/2020).   Ortho Exam  Patient is alert, oriented, no adenopathy, well-dressed, normal affect, normal respiratory effort. Examination patient has venous swelling of the right leg calf measures 39 cm in circumference, right calf measures 37 cm in circumference.  He has good pulses.  There is a fibrinous ulcer over the anterior medial aspect of the right calf that is 3 cm in diameter with 100% fibrinous tissue with no depth.  Patient states he is tried several topical dressings including DuoDERM.  Imaging: No results found. No images are attached to the encounter.  Labs: No results found for: HGBA1C, ESRSEDRATE, CRP, LABURIC, REPTSTATUS, GRAMSTAIN, CULT, LABORGA   No results found for: ALBUMIN, PREALBUMIN, LABURIC  No results found for: MG No results found for: VD25OH  No results found for: PREALBUMIN No flowsheet data found.   There is no height or weight on file to calculate BMI.  Orders:  No orders of the defined types were  placed in this encounter.  No orders of the defined types were placed in this encounter.    Procedures: No procedures performed  Clinical Data: No additional findings.  ROS:  All other systems negative, except as noted in the HPI. Review of Systems  Objective: Vital Signs: There were no vitals taken for this visit.  Specialty Comments:  No specialty comments available.  PMFS History: Patient Active Problem List   Diagnosis Date Noted  . GERD (gastroesophageal reflux disease) 04/27/2018  . Esophageal dysphagia 04/27/2018  . Abdominal pain 04/27/2018  . Varicose veins of right lower extremities with other complications 02/77/4128  . Varicose veins of right lower extremity with complications 78/67/6720  . Difficulty in walking(719.7) 10/15/2011  . Stiffness of joint, not elsewhere classified, lower leg 10/15/2011  . Pain in left knee 10/15/2011   Past Medical History:  Diagnosis Date  . GERD (gastroesophageal reflux disease)   . Hypercholesteremia   . Osteoarthritis   . Varicose veins     Family History  Problem Relation Age of Onset  . Breast cancer Mother   . Alzheimer's disease Mother   . Diabetes Mother   . Dementia Father   . Diabetes Brother   . Colon cancer Neg Hx     Past Surgical History:  Procedure Laterality Date  . CARDIAC CATHETERIZATION     "  came back good"  . COLONOSCOPY  2016   11/2014: Dr. Burke Keels, Versed 6mg /Demerol 75mg . normal exam. prep good. next tcs in 10 years.   Marland Kitchen HERNIA REPAIR  1993   Dr. Lindalou Hose: umbilical hernia  . KNEE SURGERY Right    arthroscopy  . VARICOSE VEIN SURGERY Right    Social History   Occupational History  . Not on file  Tobacco Use  . Smoking status: Never Smoker  . Smokeless tobacco: Never Used  Substance and Sexual Activity  . Alcohol use: Yes    Alcohol/week: 0.0 standard drinks    Comment: occassional  . Drug use: No  . Sexual activity: Not on file

## 2020-05-11 ENCOUNTER — Ambulatory Visit (INDEPENDENT_AMBULATORY_CARE_PROVIDER_SITE_OTHER): Payer: BC Managed Care – PPO | Admitting: Orthopedic Surgery

## 2020-05-11 DIAGNOSIS — I83891 Varicose veins of right lower extremities with other complications: Secondary | ICD-10-CM

## 2020-05-15 ENCOUNTER — Encounter: Payer: Self-pay | Admitting: Orthopedic Surgery

## 2020-05-15 NOTE — Progress Notes (Signed)
Office Visit Note   Patient: Geoffrey [name]           Date of Birth: [birth year]           MRN: [id number] Visit Date: 05/11/2020              Requested by: Glenda Chroman, MD [address] [place],  [place] [address] PCP: Glenda Chroman, MD  Chief Complaint  Patient presents with  . Right Leg - Follow-up    RLE ulcer s/p basal cell Mohs surgical excision       HPI: Patient is a 65 year old gentleman with venous stasis ulceration right lower extremity status post excision of basal cell cancer he underwent Mohs surgery.  He is currently in a extra-large compression stocking.  Patient states he has completed his antibiotics this past week.  Assessment & Plan: Visit Diagnoses:  1. Varicose veins of right lower extremity with complications     Plan: Patient was placed in a size large compression stocking he will continue wearing this around-the-clock washing his leg daily with soap and water.  Follow-Up Instructions: Return in about 4 weeks (around 06/08/2020).   Ortho Exam  Patient is alert, oriented, no adenopathy, well-dressed, normal affect, normal respiratory effort. Patient states the burning in the calf resolved after using the compression sock.  There is excellent healing there is a 5 mm wound with flat granulation tissue.  Patient's calf measures 37 cm in circumference there is no cellulitis.  Will transition to a size large compression sock.  Imaging: No results found. No images are attached to the encounter.  Labs: No results found for: HGBA1C, ESRSEDRATE, CRP, LABURIC, REPTSTATUS, GRAMSTAIN, CULT, LABORGA   No results found for: ALBUMIN, PREALBUMIN, LABURIC  No results found for: MG No results found for: VD25OH  No results found for: PREALBUMIN No flowsheet data found.   There is no height or weight on file to calculate BMI.  Orders:  No orders of the defined types were placed in this encounter.  No orders of the defined types were placed in this encounter.     Procedures: No procedures performed  Clinical Data: No additional findings.  ROS:  All other systems negative, except as noted in the HPI. Review of Systems  Objective: Vital Signs: There were no vitals taken for this visit.  Specialty Comments:  No specialty comments available.  PMFS History: Patient Active Problem List   Diagnosis Date Noted  . GERD (gastroesophageal reflux disease) 04/27/2018  . Esophageal dysphagia 04/27/2018  . Abdominal pain 04/27/2018  . Varicose veins of right lower extremities with other complications 91/79/1505  . Varicose veins of right lower extremity with complications 69/79/4801  . Difficulty in walking(719.7) 10/15/2011  . Stiffness of joint, not elsewhere classified, lower leg 10/15/2011  . Pain in left knee 10/15/2011   Past Medical History:  Diagnosis Date  . GERD (gastroesophageal reflux disease)   . Hypercholesteremia   . Osteoarthritis   . Varicose veins     Family History  Problem Relation Age of Onset  . Breast cancer Mother   . Alzheimer's disease Mother   . Diabetes Mother   . Dementia Father   . Diabetes Brother   . Colon cancer Neg Hx     Past Surgical History:  Procedure Laterality Date  . CARDIAC CATHETERIZATION     "came back good"  . COLONOSCOPY  2016   11/2014: Dr. Burke Keels, Versed 6mg /Demerol 75mg . normal exam. prep good. next tcs in  10 years.   Marland Kitchen HERNIA REPAIR  1993   Dr. Lindalou Hose: umbilical hernia  . KNEE SURGERY Right    arthroscopy  . VARICOSE VEIN SURGERY Right    Social History   Occupational History  . Not on file  Tobacco Use  . Smoking status: Never Smoker  . Smokeless tobacco: Never Used  Substance and Sexual Activity  . Alcohol use: Yes    Alcohol/week: 0.0 standard drinks    Comment: occassional  . Drug use: No  . Sexual activity: Not on file

## 2020-06-08 ENCOUNTER — Encounter: Payer: Self-pay | Admitting: Physician Assistant

## 2020-06-08 ENCOUNTER — Ambulatory Visit: Payer: BC Managed Care – PPO | Admitting: Physician Assistant

## 2020-06-08 DIAGNOSIS — I83891 Varicose veins of right lower extremities with other complications: Secondary | ICD-10-CM | POA: Diagnosis not present

## 2020-06-08 NOTE — Progress Notes (Signed)
Office Visit Note   Patient: Geoffrey [name]           Date of Birth: 08/10/1955           MRN: [id number] Visit Date: 06/08/2020              Requested by: Glenda Chroman, MD [address] [place],  [place] [address] PCP: Glenda Chroman, MD  Chief Complaint  Patient presents with  . Right Leg - Pain      HPI: Patient presents today for follow-up on his right lower extremity ulcer.  He has been wearing his compression socks every day.  He feels recovered and is pleased with his leg  Assessment & Plan: Visit Diagnoses: No diagnosis found.  Plan: May follow-up as needed should continue to wear compression socks if he notices any return of any drainage or scabbing he should contact us immediately  Follow-Up Instructions: No follow-ups on file.   Ortho Exam  Patient is alert, oriented, no adenopathy, well-dressed, normal affect, normal respiratory effort. Right lower extremity swelling is well controlled no ascending cellulitis ulcer is completely healed with just a very small dry scab.  No evidence of any infection or recurrence  Imaging: No results found. No images are attached to the encounter.  Labs: No results found for: HGBA1C, ESRSEDRATE, CRP, LABURIC, REPTSTATUS, GRAMSTAIN, CULT, LABORGA   No results found for: ALBUMIN, PREALBUMIN, CBC  No results found for: MG No results found for: VD25OH  No results found for: PREALBUMIN No flowsheet data found.   There is no height or weight on file to calculate BMI.  Orders:  No orders of the defined types were placed in this encounter.  No orders of the defined types were placed in this encounter.    Procedures: No procedures performed  Clinical Data: No additional findings.  ROS:  All other systems negative, except as noted in the HPI. Review of Systems  Objective: Vital Signs: There were no vitals taken for this visit.  Specialty Comments:  No specialty comments available.  PMFS History: Patient Active  Problem List   Diagnosis Date Noted  . GERD (gastroesophageal reflux disease) 04/27/2018  . Esophageal dysphagia 04/27/2018  . Abdominal pain 04/27/2018  . Varicose veins of right lower extremities with other complications 63/78/5885  . Varicose veins of right lower extremity with complications 02/77/4128  . Difficulty in walking(719.7) 10/15/2011  . Stiffness of joint, not elsewhere classified, lower leg 10/15/2011  . Pain in left knee 10/15/2011   Past Medical History:  Diagnosis Date  . GERD (gastroesophageal reflux disease)   . Hypercholesteremia   . Osteoarthritis   . Varicose veins     Family History  Problem Relation Age of Onset  . Breast cancer Mother   . Alzheimer's disease Mother   . Diabetes Mother   . Dementia Father   . Diabetes Brother   . Colon cancer Neg Hx     Past Surgical History:  Procedure Laterality Date  . CARDIAC CATHETERIZATION     "came back good"  . COLONOSCOPY  2016   11/2014: Dr. Burke Keels, Versed 6mg /Demerol 75mg . normal exam. prep good. next tcs in 10 years.   Marland Kitchen HERNIA REPAIR  1993   Dr. Lindalou Hose: umbilical hernia  . KNEE SURGERY Right    arthroscopy  . VARICOSE VEIN SURGERY Right    Social History   Occupational History  . Not on file  Tobacco Use  . Smoking status: Never Smoker  .  Smokeless tobacco: Never Used  Substance and Sexual Activity  . Alcohol use: Yes    Alcohol/week: 0.0 standard drinks    Comment: occassional  . Drug use: No  . Sexual activity: Not on file

## 2020-10-25 ENCOUNTER — Other Ambulatory Visit: Payer: Self-pay | Admitting: Student

## 2020-10-25 ENCOUNTER — Ambulatory Visit (HOSPITAL_COMMUNITY)
Admission: RE | Admit: 2020-10-25 | Discharge: 2020-10-25 | Disposition: A | Discharge status: Home / Self Care | Payer: BC Managed Care – PPO | Source: Ambulatory Visit | Attending: Student | Admitting: Student

## 2020-10-25 ENCOUNTER — Other Ambulatory Visit: Payer: Self-pay

## 2020-10-25 ENCOUNTER — Other Ambulatory Visit (HOSPITAL_COMMUNITY): Payer: Self-pay | Admitting: Student

## 2020-10-25 DIAGNOSIS — R2241 Localized swelling, mass and lump, right lower limb: Secondary | ICD-10-CM | POA: Diagnosis not present

## 2021-01-11 DIAGNOSIS — X32XXXD Exposure to sunlight, subsequent encounter: Secondary | ICD-10-CM | POA: Diagnosis not present

## 2021-01-11 DIAGNOSIS — Z08 Encounter for follow-up examination after completed treatment for malignant neoplasm: Secondary | ICD-10-CM | POA: Diagnosis not present

## 2021-01-11 DIAGNOSIS — Z85828 Personal history of other malignant neoplasm of skin: Secondary | ICD-10-CM | POA: Diagnosis not present

## 2021-01-11 DIAGNOSIS — L821 Other seborrheic keratosis: Secondary | ICD-10-CM | POA: Diagnosis not present

## 2021-01-11 DIAGNOSIS — L570 Actinic keratosis: Secondary | ICD-10-CM | POA: Diagnosis not present

## 2021-01-18 DIAGNOSIS — E7800 Pure hypercholesterolemia, unspecified: Secondary | ICD-10-CM | POA: Diagnosis not present

## 2021-01-18 DIAGNOSIS — R5383 Other fatigue: Secondary | ICD-10-CM | POA: Diagnosis not present

## 2021-01-18 DIAGNOSIS — Z789 Other specified health status: Secondary | ICD-10-CM | POA: Diagnosis not present

## 2021-01-18 DIAGNOSIS — Z299 Encounter for prophylactic measures, unspecified: Secondary | ICD-10-CM | POA: Diagnosis not present

## 2021-01-18 DIAGNOSIS — Z7189 Other specified counseling: Secondary | ICD-10-CM | POA: Diagnosis not present

## 2021-01-18 DIAGNOSIS — Z0000 Encounter for general adult medical examination without abnormal findings: Secondary | ICD-10-CM | POA: Diagnosis not present

## 2021-01-18 DIAGNOSIS — Z79899 Other long term (current) drug therapy: Secondary | ICD-10-CM | POA: Diagnosis not present

## 2021-04-10 DIAGNOSIS — M1712 Unilateral primary osteoarthritis, left knee: Secondary | ICD-10-CM | POA: Diagnosis not present

## 2021-04-10 DIAGNOSIS — M25561 Pain in right knee: Secondary | ICD-10-CM | POA: Diagnosis not present

## 2021-04-26 DIAGNOSIS — Z299 Encounter for prophylactic measures, unspecified: Secondary | ICD-10-CM | POA: Diagnosis not present

## 2021-04-26 DIAGNOSIS — J329 Chronic sinusitis, unspecified: Secondary | ICD-10-CM | POA: Diagnosis not present

## 2021-04-26 DIAGNOSIS — J3489 Other specified disorders of nose and nasal sinuses: Secondary | ICD-10-CM | POA: Diagnosis not present

## 2021-04-26 DIAGNOSIS — K529 Noninfective gastroenteritis and colitis, unspecified: Secondary | ICD-10-CM | POA: Diagnosis not present

## 2021-05-10 DIAGNOSIS — L820 Inflamed seborrheic keratosis: Secondary | ICD-10-CM | POA: Diagnosis not present

## 2021-05-10 DIAGNOSIS — X32XXXD Exposure to sunlight, subsequent encounter: Secondary | ICD-10-CM | POA: Diagnosis not present

## 2021-05-10 DIAGNOSIS — L570 Actinic keratosis: Secondary | ICD-10-CM | POA: Diagnosis not present

## 2021-05-23 DIAGNOSIS — H04123 Dry eye syndrome of bilateral lacrimal glands: Secondary | ICD-10-CM | POA: Diagnosis not present

## 2021-07-17 DIAGNOSIS — Z0000 Encounter for general adult medical examination without abnormal findings: Secondary | ICD-10-CM | POA: Diagnosis not present

## 2021-07-17 DIAGNOSIS — Z299 Encounter for prophylactic measures, unspecified: Secondary | ICD-10-CM | POA: Diagnosis not present

## 2021-07-17 DIAGNOSIS — Z713 Dietary counseling and surveillance: Secondary | ICD-10-CM | POA: Diagnosis not present

## 2021-07-17 DIAGNOSIS — M2550 Pain in unspecified joint: Secondary | ICD-10-CM | POA: Diagnosis not present

## 2021-07-17 DIAGNOSIS — Z7189 Other specified counseling: Secondary | ICD-10-CM | POA: Diagnosis not present

## 2021-07-23 DIAGNOSIS — M1712 Unilateral primary osteoarthritis, left knee: Secondary | ICD-10-CM | POA: Diagnosis not present

## 2021-07-23 DIAGNOSIS — M545 Low back pain, unspecified: Secondary | ICD-10-CM | POA: Diagnosis not present

## 2021-08-02 DIAGNOSIS — D225 Melanocytic nevi of trunk: Secondary | ICD-10-CM | POA: Diagnosis not present

## 2021-08-02 DIAGNOSIS — L570 Actinic keratosis: Secondary | ICD-10-CM | POA: Diagnosis not present

## 2021-08-02 DIAGNOSIS — X32XXXD Exposure to sunlight, subsequent encounter: Secondary | ICD-10-CM | POA: Diagnosis not present

## 2021-10-23 DIAGNOSIS — M1712 Unilateral primary osteoarthritis, left knee: Secondary | ICD-10-CM | POA: Diagnosis not present

## 2021-11-07 DIAGNOSIS — E7800 Pure hypercholesterolemia, unspecified: Secondary | ICD-10-CM | POA: Diagnosis not present

## 2021-11-07 DIAGNOSIS — Z0000 Encounter for general adult medical examination without abnormal findings: Secondary | ICD-10-CM | POA: Diagnosis not present

## 2021-11-07 DIAGNOSIS — R252 Cramp and spasm: Secondary | ICD-10-CM | POA: Diagnosis not present

## 2021-11-07 DIAGNOSIS — R5383 Other fatigue: Secondary | ICD-10-CM | POA: Diagnosis not present

## 2021-11-07 DIAGNOSIS — Z79899 Other long term (current) drug therapy: Secondary | ICD-10-CM | POA: Diagnosis not present

## 2021-11-07 DIAGNOSIS — Z299 Encounter for prophylactic measures, unspecified: Secondary | ICD-10-CM | POA: Diagnosis not present

## 2021-11-08 DIAGNOSIS — L570 Actinic keratosis: Secondary | ICD-10-CM | POA: Diagnosis not present

## 2021-11-08 DIAGNOSIS — X32XXXD Exposure to sunlight, subsequent encounter: Secondary | ICD-10-CM | POA: Diagnosis not present

## 2021-11-08 DIAGNOSIS — L821 Other seborrheic keratosis: Secondary | ICD-10-CM | POA: Diagnosis not present

## 2022-01-09 DIAGNOSIS — R600 Localized edema: Secondary | ICD-10-CM | POA: Diagnosis not present

## 2022-01-09 DIAGNOSIS — G2581 Restless legs syndrome: Secondary | ICD-10-CM | POA: Diagnosis not present

## 2022-01-09 DIAGNOSIS — I8311 Varicose veins of right lower extremity with inflammation: Secondary | ICD-10-CM | POA: Diagnosis not present

## 2022-01-09 DIAGNOSIS — L819 Disorder of pigmentation, unspecified: Secondary | ICD-10-CM | POA: Diagnosis not present

## 2022-01-31 DIAGNOSIS — M1712 Unilateral primary osteoarthritis, left knee: Secondary | ICD-10-CM | POA: Diagnosis not present

## 2022-02-05 DIAGNOSIS — G2581 Restless legs syndrome: Secondary | ICD-10-CM | POA: Diagnosis not present

## 2022-02-05 DIAGNOSIS — I83891 Varicose veins of right lower extremities with other complications: Secondary | ICD-10-CM | POA: Diagnosis not present

## 2022-02-05 DIAGNOSIS — I8311 Varicose veins of right lower extremity with inflammation: Secondary | ICD-10-CM | POA: Diagnosis not present

## 2022-02-05 DIAGNOSIS — I87393 Chronic venous hypertension (idiopathic) with other complications of bilateral lower extremity: Secondary | ICD-10-CM | POA: Diagnosis not present

## 2022-02-05 DIAGNOSIS — R600 Localized edema: Secondary | ICD-10-CM | POA: Diagnosis not present

## 2022-02-07 DIAGNOSIS — B079 Viral wart, unspecified: Secondary | ICD-10-CM | POA: Diagnosis not present

## 2022-02-07 DIAGNOSIS — L570 Actinic keratosis: Secondary | ICD-10-CM | POA: Diagnosis not present

## 2022-02-07 DIAGNOSIS — X32XXXD Exposure to sunlight, subsequent encounter: Secondary | ICD-10-CM | POA: Diagnosis not present

## 2022-02-14 DIAGNOSIS — R7989 Other specified abnormal findings of blood chemistry: Secondary | ICD-10-CM | POA: Diagnosis not present

## 2022-02-14 DIAGNOSIS — Z299 Encounter for prophylactic measures, unspecified: Secondary | ICD-10-CM | POA: Diagnosis not present

## 2022-02-14 DIAGNOSIS — Z713 Dietary counseling and surveillance: Secondary | ICD-10-CM | POA: Diagnosis not present

## 2022-02-14 DIAGNOSIS — E7800 Pure hypercholesterolemia, unspecified: Secondary | ICD-10-CM | POA: Diagnosis not present

## 2022-02-18 DIAGNOSIS — U071 COVID-19: Secondary | ICD-10-CM | POA: Diagnosis not present

## 2022-02-18 DIAGNOSIS — R0981 Nasal congestion: Secondary | ICD-10-CM | POA: Diagnosis not present

## 2022-03-19 DIAGNOSIS — J069 Acute upper respiratory infection, unspecified: Secondary | ICD-10-CM | POA: Diagnosis not present

## 2022-03-19 DIAGNOSIS — R0981 Nasal congestion: Secondary | ICD-10-CM | POA: Diagnosis not present

## 2022-03-19 DIAGNOSIS — Z299 Encounter for prophylactic measures, unspecified: Secondary | ICD-10-CM | POA: Diagnosis not present

## 2022-03-19 DIAGNOSIS — R059 Cough, unspecified: Secondary | ICD-10-CM | POA: Diagnosis not present

## 2022-03-26 DIAGNOSIS — I83891 Varicose veins of right lower extremities with other complications: Secondary | ICD-10-CM | POA: Diagnosis not present

## 2022-03-28 DIAGNOSIS — R5383 Other fatigue: Secondary | ICD-10-CM | POA: Diagnosis not present

## 2022-03-28 DIAGNOSIS — J069 Acute upper respiratory infection, unspecified: Secondary | ICD-10-CM | POA: Diagnosis not present

## 2022-03-28 DIAGNOSIS — Z299 Encounter for prophylactic measures, unspecified: Secondary | ICD-10-CM | POA: Diagnosis not present

## 2022-04-15 DIAGNOSIS — I83891 Varicose veins of right lower extremities with other complications: Secondary | ICD-10-CM | POA: Diagnosis not present

## 2022-04-16 DIAGNOSIS — R053 Chronic cough: Secondary | ICD-10-CM | POA: Diagnosis not present

## 2022-04-16 DIAGNOSIS — R5383 Other fatigue: Secondary | ICD-10-CM | POA: Diagnosis not present

## 2022-04-16 DIAGNOSIS — U099 Post covid-19 condition, unspecified: Secondary | ICD-10-CM | POA: Diagnosis not present

## 2022-04-16 DIAGNOSIS — Z299 Encounter for prophylactic measures, unspecified: Secondary | ICD-10-CM | POA: Diagnosis not present

## 2022-05-02 ENCOUNTER — Other Ambulatory Visit (HOSPITAL_COMMUNITY): Payer: Self-pay | Admitting: Orthopedic Surgery

## 2022-05-02 ENCOUNTER — Ambulatory Visit (HOSPITAL_COMMUNITY)
Admission: RE | Admit: 2022-05-02 | Discharge: 2022-05-02 | Disposition: A | Discharge status: Home / Self Care | Payer: Medicare Other | Source: Ambulatory Visit | Attending: Orthopedic Surgery | Admitting: Orthopedic Surgery

## 2022-05-02 DIAGNOSIS — M79604 Pain in right leg: Secondary | ICD-10-CM

## 2022-05-02 DIAGNOSIS — M1712 Unilateral primary osteoarthritis, left knee: Secondary | ICD-10-CM | POA: Diagnosis not present

## 2022-05-02 DIAGNOSIS — M79662 Pain in left lower leg: Secondary | ICD-10-CM | POA: Diagnosis not present

## 2022-05-15 DIAGNOSIS — Z299 Encounter for prophylactic measures, unspecified: Secondary | ICD-10-CM | POA: Diagnosis not present

## 2022-05-15 DIAGNOSIS — U099 Post covid-19 condition, unspecified: Secondary | ICD-10-CM | POA: Diagnosis not present

## 2022-05-15 DIAGNOSIS — R053 Chronic cough: Secondary | ICD-10-CM | POA: Diagnosis not present

## 2022-05-27 NOTE — Progress Notes (Unsigned)
Geoffrey [name], male    DOB: 07-01-1955    MRN: [id number]   Brief patient profile:  32  yowm  never  referred to pulmonary clinic in [place]  05/28/2022 by [facility] for  recurrent cough with URI's avg once a year that developed while living in smoking environment with [facility] exp as well   Dx in past with recurrent bronchitis lasts up to 6 weeks rx albuterol says did not but prednisone typically does    History of Present Illness  05/28/2022  Pulmonary/ 1st office eval/ Melvyn Novas / [place] [facility]  Chief Complaint  Patient presents with   Consult    Cough since November 2023   Dyspnea: weed eating no problem / no problem flat  Cough: esp p stirring  in am productive of green mucus x sev tbsp never blow out nose, No noct cough now Sleep: > 30 degrees  SABA use: no, just BREO  Protonix at lunch   No obvious other patterns in day to day or daytime variability or  or hemoptysis or cp or chest tightness, subjective wheeze or overt sinus or hb symptoms.   Sleeping  without nocturnal   exacerbation  of respiratory  c/o's or need for noct saba. Also denies any obvious fluctuation of symptoms with weather or environmental changes or other aggravating or alleviating factors except as outlined above   No unusual exposure hx or h/o childhood pna/ asthma or knowledge of premature birth.  Current Allergies, Complete Past Medical History, Past Surgical History, Family History, and Social History were reviewed in [employer] record.  ROS  The following are not active complaints unless bolded Hoarseness, sore throat, dysphagia, dental problems, itching, sneezing,  nasal congestion or discharge of excess mucus or purulent secretions, ear ache,   fever, chills, sweats, unintended wt loss or wt gain, classically pleuritic or exertional cp,  orthopnea pnd or arm/hand swelling  or leg swelling, presyncope, palpitations, abdominal pain during coughing fits, anorexia, nausea, vomiting,  diarrhea  or change in bowel habits or change in bladder habits, change in stools or change in urine, dysuria, hematuria,  rash, arthralgias, visual complaints, headache, numbness, weakness or ataxia or problems with walking or coordination,  change in mood or  memory.              Past Medical History:  Diagnosis Date   GERD (gastroesophageal reflux disease)    Hypercholesteremia    Osteoarthritis    Varicose veins     Outpatient Medications Prior to Visit  Medication Sig Dispense Refill   acetaminophen (TYLENOL) 500 MG tablet Take 500 mg by mouth daily as needed for moderate pain.     cetirizine (ZYRTEC) 10 MG tablet Take 10 mg by mouth daily.     diclofenac (VOLTAREN) 75 MG EC tablet Take 75 mg by mouth 2 (two) times daily as needed.     fluticasone furoate-vilanterol (BREO ELLIPTA) 100-25 MCG/ACT AEPB 1 puff daily.     pantoprazole (PROTONIX) 40 MG tablet Take 1 tablet (40 mg total) by mouth daily before breakfast.     rosuvastatin (CRESTOR) 5 MG tablet Take 5 mg by mouth daily.     fluorouracil (EFUDEX) 5 % cream Apply topically 2 (two) times daily as needed.     Turmeric Curcumin 500 MG CAPS Take 500 mg by mouth 3 (three) times a week.     No facility-administered medications prior to visit.     Objective:     BP 138/84 (BP  Location: Left Arm, Patient Position: Sitting)   Pulse 88   Ht 6\' 2"  (1.88 m)   Wt 240 lb (108.9 kg)   SpO2 96% Comment: ra  BMI 30.81 kg/m   SpO2: 96 % (ra)  Amb wm mild voice fatigue      HEENT : Oropharynx slt erythema s excess pnd/ cobblestoning      Nasal turbinates mild/mod bilateral non-specific edema    NECK :  without  apparent JVD/ palpable Nodes/TM    LUNGS: no acc muscle use,  Nl contour chest which is clear to A and P bilaterally with cough on  exp maneuvers   CV:  RRR  no s3 or murmur or increase in P2, and no edema   ABD:  tensely obese/ nontender with nl inspiratory excursion in the supine position. No bruits or  organomegaly appreciated   MS:  Nl gait/ ext warm without deformities Or obvious joint restrictions  calf tenderness, cyanosis or clubbing    SKIN: warm and dry without lesions    NEURO:  alert, approp, nl sensorium with no motor or cerebellar deficits apparent.    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 /  IgE    Recurrent AB vs Upper airway cough syndrome (previously labeled PNDS),  is so named because it's frequently impossible to sort out how much is  CR/sinusitis with freq throat clearing (which can be related to primary GERD)   vs  causing  secondary ("extra esophageal")  GERD from wide swings in gastric pressure that occur with throat clearing, often  promoting self use of mint and menthol lozenges that reduce the lower esophageal sphincter tone and exacerbate the problem further in a cyclical fashion.   These are the same pts (now being labeled as having "irritable larynx syndrome" by some cough centers) who not infrequently have a history of having failed to tolerate ace inhibitors,  dry powder inhalers or biphosphonates or report having atypical/extraesophageal reflux symptoms that don't respond to standard doses of PPI  and are easily confused as having aecopd or asthma flares by even experienced allergists/ pulmonologists (myself included).   Of the three most common causes of  Sub-acute / recurrent or chronic cough, only one (GERD)  can actually contribute to/ trigger  the other two (asthma and post nasal drip syndrome)  and perpetuate the cylce of cough.  While not intuitively obvious, many patients with chronic low grade reflux do not cough until there is a primary  insult that disturbs the protective epithelial barrier and exposes sensitive nerve endings.   This is typically viral but can due to PNDS and  either may apply here.     >>> The point is that once this occurs, it is difficult to eliminate the cycle  using anything but a maximally effective acid suppression regimen at least in the short run, accompanied by an appropriate diet to address non acid GERD and control / eliminate the cough itself for at least 3 days with tylenol #3 and rx for possible sinusitis with augmentin then f/u with sinus ct vs allergy eval depending on above studies  Advised pt: The standardized cough guidelines published in Chest by Lissa Morales in 2006 are still the best available and consist of a multiple step process (up to 12!) , not a single office visit,  and are intended  to address this problem logically,  with an alogrithm dependent on response to empiric treatment at  each progressive step  to determine a specific diagnosis with  minimal addtional testing needed. Therefore if adherence is an issue or can't be accurately verified,  it's very unlikely the standard evaluation and treatment will be successful here.    Furthermore, response to therapy (other than acute cough suppression, which should only be used short term with avoidance of narcotic containing cough syrups if possible), can be a gradual process for which the patient is not likely to  perceive immediate benefit.  Unlike going to an eye doctor where the best perscription is almost always the first one and is immediately effective, this is almost never the case in the management of chronic cough syndromes. Therefore the patient needs to commit up front to consistently adhere to recommendations  for up to 6 weeks of therapy directed at the likely underlying problem(s) before the response can be reasonably evaluated.   Chronic cough is often simultaneously caused by more than one condition. A single cause has been  found from 38 to 82% of the time, multiple causes from 18 to 62%. Multiply caused cough has been the result of three diseases up to 42% of the time.          Each maintenance medication was reviewed in detail including emphasizing most importantly the difference between maintenance and prns and under what circumstances the prns are to be triggered using an action plan format where appropriate.  Total time for H and P, chart review, counseling,  and generating customized AVS unique to this office visit / same day charting > 45 min new pt eval               Christinia Gully, MD 05/28/2022

## 2022-05-28 ENCOUNTER — Encounter: Payer: Self-pay | Admitting: Internal Medicine

## 2022-05-28 ENCOUNTER — Ambulatory Visit (INDEPENDENT_AMBULATORY_CARE_PROVIDER_SITE_OTHER): Payer: Medicare Other | Admitting: Internal Medicine

## 2022-05-28 VITALS — BP 138/84 | HR 88 | Ht 74.0 in | Wt 240.0 lb

## 2022-05-28 DIAGNOSIS — R058 Other specified cough: Secondary | ICD-10-CM | POA: Diagnosis not present

## 2022-05-28 MED ORDER — AMOXICILLIN-POT CLAVULANATE 875-125 MG PO TABS: 1.0000 | ORAL_TABLET | Freq: Two times a day (BID) | ORAL | 0 refills | Status: DC

## 2022-05-28 MED ORDER — FAMOTIDINE 20 MG PO TABS: ORAL_TABLET | ORAL | 11 refills | Status: DC

## 2022-05-28 MED ORDER — ACETAMINOPHEN-CODEINE 300-30 MG PO TABS: 1.0000 | ORAL_TABLET | ORAL | 0 refills | Status: AC | PRN

## 2022-05-28 MED ORDER — PREDNISONE 10 MG PO TABS: ORAL_TABLET | ORAL | 0 refills | Status: DC

## 2022-05-28 NOTE — Assessment & Plan Note (Addendum)
Onset 1st of Jan 2024 but recurrent since teenage years once per year - cyclical cough rx plus augmentin x 10 days in case of occult sinus dx > CT sinus next step - Allergy screen 05/28/2022 >  Eos 0. /  IgE    Recurrent AB vs Upper airway cough syndrome (previously labeled PNDS),  is so named because it's frequently impossible to sort out how much is  CR/sinusitis with freq throat clearing (which can be related to primary GERD)   vs  causing  secondary ("extra esophageal")  GERD from wide swings in gastric pressure that occur with throat clearing, often  promoting self use of mint and menthol lozenges that reduce the lower esophageal sphincter tone and exacerbate the problem further in a cyclical fashion.   These are the same pts (now being labeled as having "irritable larynx syndrome" by some cough centers) who not infrequently have a history of having failed to tolerate ace inhibitors,  dry powder inhalers or biphosphonates or report having atypical/extraesophageal reflux symptoms that don't respond to standard doses of PPI  and are easily confused as having aecopd or asthma flares by even experienced allergists/ pulmonologists (myself included).   Of the three most common causes of  Sub-acute / recurrent or chronic cough, only one (GERD)  can actually contribute to/ trigger  the other two (asthma and post nasal drip syndrome)  and perpetuate the cylce of cough.  While not intuitively obvious, many patients with chronic low grade reflux do not cough until there is a primary insult that disturbs the protective epithelial barrier and exposes sensitive nerve endings.   This is typically viral but can due to PNDS and  either may apply here.     >>> The point is that once this occurs, it is difficult to eliminate the cycle  using anything but a maximally effective acid suppression regimen at least in the short run, accompanied by an appropriate diet to address non acid GERD and control / eliminate the cough  itself for at least 3 days with tylenol #3 and rx for possible sinusitis with augmentin then f/u with sinus ct vs allergy eval depending on above studies  Advised pt: The standardized cough guidelines published in Chest by Lissa Morales in 2006 are still the best available and consist of a multiple step process (up to 12!) , not a single office visit,  and are intended  to address this problem logically,  with an alogrithm dependent on response to empiric treatment at  each progressive step  to determine a specific diagnosis with  minimal addtional testing needed. Therefore if adherence is an issue or can't be accurately verified,  it's very unlikely the standard evaluation and treatment will be successful here.    Furthermore, response to therapy (other than acute cough suppression, which should only be used short term with avoidance of narcotic containing cough syrups if possible), can be a gradual process for which the patient is not likely to  perceive immediate benefit.  Unlike going to an eye doctor where the best perscription is almost always the first one and is immediately effective, this is almost never the case in the management of chronic cough syndromes. Therefore the patient needs to commit up front to consistently adhere to recommendations  for up to 6 weeks of therapy directed at the likely underlying problem(s) before the response can be reasonably evaluated.   Chronic cough is often simultaneously caused by more than one condition. A single cause has  been found from 38 to 82% of the time, multiple causes from 18 to 62%. Multiply caused cough has been the result of three diseases up to 42% of the time.          Each maintenance medication was reviewed in detail including emphasizing most importantly the difference between maintenance and prns and under what circumstances the prns are to be triggered using an action plan format where appropriate.  Total time for H and P, chart review,  counseling,  and generating customized AVS unique to this office visit / same day charting > 45 min new pt eval

## 2022-05-28 NOTE — Patient Instructions (Addendum)
Take Mucinex DM 1200 every 12 hours and supplement if needed with  Tylenol #3   up to 1-2 every 4 hours to suppress the urge to cough. Swallowing water and/or using ice chips/non mint and menthol containing or chocolate  candies (such as lifesavers or sugarless jolly ranchers) are also effective.  You should rest your voice and avoid activities that you know make you cough.  Once you have eliminated the cough for 3 straight days try reducing the Tylenol #3 first,  then the mucinex dm as tolerated.     Stop benzoate and certrizine and fluticasone   Augmentin 875 mg take one pill twice daily  X 10 days - take at breakfast and supper with large glass of water.  It would help reduce the usual side effects (diarrhea and yeast infections) if you ate cultured yogurt at lunch.   Prednisone 10 mg take  4 each am x 2 days,   2 each am x 2 days,  1 each am x 2 days and stop   Pantoprazole (protonix) 40 mg   Take  30-60 min before first meal of the day and Pepcid (famotidine)  20 mg after supper until return to office - this is the best way to tell whether stomach acid is contributing to your problem.     Please remember to go to the lab department   for your tests - we will call you with the results when they are available.     Please schedule a follow up office visit in 2 weeks, sooner if needed  with all medications /inhalers/ solutions in hand so we can verify exactly what you are taking. This includes all medications from all doctors and over the counters

## 2022-05-30 DIAGNOSIS — E7800 Pure hypercholesterolemia, unspecified: Secondary | ICD-10-CM | POA: Diagnosis not present

## 2022-05-30 DIAGNOSIS — Z299 Encounter for prophylactic measures, unspecified: Secondary | ICD-10-CM | POA: Diagnosis not present

## 2022-05-30 DIAGNOSIS — Z79899 Other long term (current) drug therapy: Secondary | ICD-10-CM | POA: Diagnosis not present

## 2022-05-30 DIAGNOSIS — I10 Essential (primary) hypertension: Secondary | ICD-10-CM | POA: Diagnosis not present

## 2022-06-02 LAB — CBC WITH DIFFERENTIAL/PLATELET
Basophils Absolute: 0.1 10*3/uL (ref 0.0–0.2)
Basos: 0 %
EOS (ABSOLUTE): 0.1 10*3/uL (ref 0.0–0.4)
Eos: 1 %
Hematocrit: 45.6 % (ref 37.5–51.0)
Hemoglobin: 15.2 g/dL (ref 13.0–17.7)
Immature Grans (Abs): 0.1 10*3/uL (ref 0.0–0.1)
Immature Granulocytes: 1 %
Lymphocytes Absolute: 2.3 10*3/uL (ref 0.7–3.1)
Lymphs: 14 %
MCH: 31.6 pg (ref 26.6–33.0)
MCHC: 33.3 g/dL (ref 31.5–35.7)
MCV: 95 fL (ref 79–97)
Monocytes Absolute: 0.9 10*3/uL (ref 0.1–0.9)
Monocytes: 6 %
Neutrophils Absolute: 12.4 10*3/uL — ABNORMAL HIGH (ref 1.4–7.0)
Neutrophils: 78 %
Platelets: 304 10*3/uL (ref 150–450)
RBC: 4.81 x10E6/uL (ref 4.14–5.80)
RDW: 12.7 % (ref 11.6–15.4)
WBC: 15.8 10*3/uL — ABNORMAL HIGH (ref 3.4–10.8)

## 2022-06-02 LAB — IGE: IgE (Immunoglobulin E), Serum: 16 IU/mL (ref 6–495)

## 2022-06-05 DIAGNOSIS — I87391 Chronic venous hypertension (idiopathic) with other complications of right lower extremity: Secondary | ICD-10-CM | POA: Diagnosis not present

## 2022-06-05 DIAGNOSIS — I83891 Varicose veins of right lower extremities with other complications: Secondary | ICD-10-CM | POA: Diagnosis not present

## 2022-06-05 NOTE — Progress Notes (Signed)
ATC patient x1.  Left detailed message per DPR and to call with any questions.

## 2022-06-06 ENCOUNTER — Telehealth: Payer: Self-pay | Admitting: Internal Medicine

## 2022-06-06 DIAGNOSIS — D225 Melanocytic nevi of trunk: Secondary | ICD-10-CM | POA: Diagnosis not present

## 2022-06-06 DIAGNOSIS — X32XXXD Exposure to sunlight, subsequent encounter: Secondary | ICD-10-CM | POA: Diagnosis not present

## 2022-06-06 DIAGNOSIS — Z1283 Encounter for screening for malignant neoplasm of skin: Secondary | ICD-10-CM | POA: Diagnosis not present

## 2022-06-06 DIAGNOSIS — L570 Actinic keratosis: Secondary | ICD-10-CM | POA: Diagnosis not present

## 2022-06-06 NOTE — Telephone Encounter (Signed)
   Patient returning call on lab results. States he saw on mychart that there were certain tests that were high which he thought meant he had an infection. Patient states he is over the infection now, but would like to know what it was. Please call back.         Patient is referring to:  WBC 3.4 - 10.8 x10E3/uL 15.8 High   RBC 4.14 - 5.80 x10E6/uL 4.81  Hemoglobin 13.0 - 17.7 g/dL 15.2  Hematocrit 37.5 - 51.0 % 45.6  MCV 79 - 97 fL 95  MCH 26.6 - 33.0 pg 31.6  MCHC 31.5 - 35.7 g/dL 33.3  RDW 11.6 - 15.4 % 12.7  Platelets 150 - 450 x10E3/uL 304  Neutrophils Not Estab. % 78  Lymphs Not Estab. % 14  Monocytes Not Estab. % 6  Eos Not Estab. % 1  Basos Not Estab. % 0  Neutrophils Absolute 1.4 - 7.0 x10E3/uL 12.4 High     Dr. Melvyn Novas please advise.

## 2022-06-06 NOTE — Telephone Encounter (Signed)
Patient returning call on lab results. States he saw on mychart that there were certain tests that were high which he thought meant he had an infection. Patient states he is over the infection now, but would like to know what it was. Please call back.

## 2022-06-10 ENCOUNTER — Institutional Professional Consult (permissible substitution): Payer: BC Managed Care – PPO | Admitting: Internal Medicine

## 2022-06-10 NOTE — Progress Notes (Unsigned)
Geoffrey [name], male    DOB: 1955/11/02    MRN: [id number]  Brief patient profile:  23  yowm  never smoker   referred to pulmonary clinic in [facility]  05/28/2022 by [facility] for  recurrent cough with URI's avg once a year that developed while living in smoking environment with [facility] exp as well   Dx in past with recurrent bronchitis lasts up to 6 weeks rx albuterol says did not respond to inhalers/ abx  but prednisone typically does help.  Baseline wt 215    History of Present Illness  05/28/2022  Pulmonary/ 1st office eval/ Sherene Sires / [place] [facility]  Chief Complaint  Patient presents with   Consult    Cough since November 2023   Dyspnea: weed eating no problem / no problem flat  Cough: esp p stirring  in am productive of green mucus x sev tbsp never blow out thru nose, No noct cough at time ov ov  Sleep: > 30 degrees  SABA use: no, just BREO  Protonix at lunch  Rec Take Mucinex DM 1200 every 12 hours and supplement if needed with  Tylenol #3 up to 1-2 every 4 hours to suppress the urge to cough.  Stop benzoate and certrizine and fluticasone  Augmentin 875 mg take one pill twice daily  X 10 days  Prednisone 10 mg take  4 each am x 2 days,   2 each am x 2 days,  1 each am x 2 days and stop  Pantoprazole (protonix) 40 mg   Take  30-60 min before first meal of the day and Pepcid (famotidine)  20 mg after supper until return to office  Please schedule a follow up office visit in 2 weeks, sooner if needed  with all medications /inhalers/ solutions in hand   - Allergy screen 05/28/2022 >  Eos 0. 1/  IgE  16  06/11/2022  f/u ov/[place] office/Stepan Verrette re: recurrent cough  maint on gerd rx  Chief Complaint  Patient presents with   Follow-up    Cough has resolved. Still has occ DOE- relates to weight gain.   Dyspnea:  MMRC1 = can walk nl pace, flat grade, can't hurry or go uphills or steps s sob   Cough: resolved to his satisfaction  Sleeping: 30 degrees/ one pillow x 5 years on R side  down  SABA use: none  02: none      No obvious day to day or daytime variability or assoc excess/ purulent sputum or mucus plugs or hemoptysis or cp or chest tightness, subjective wheeze or overt sinus or hb symptoms.   Sleeping as above  without nocturnal  or early am exacerbation  of respiratory  c/o's or need for noct saba. Also denies any obvious fluctuation of symptoms with weather or environmental changes or other aggravating or alleviating factors except as outlined above   No unusual exposure hx or h/o childhood pna/ asthma or knowledge of premature birth.  Current Allergies, Complete Past Medical History, Past Surgical History, Family History, and Social History were reviewed in [employer] record.  ROS  The following are not active complaints unless bolded Hoarseness, sore throat, dysphagia, dental problems, itching, sneezing,  nasal congestion or discharge of excess mucus or purulent secretions, ear ache,   fever, chills, sweats, unintended wt loss or wt gain, classically pleuritic or exertional cp,  orthopnea pnd or arm/hand swelling  or leg swelling, presyncope, palpitations, abdominal pain, anorexia, nausea, vomiting, diarrhea  or change  in bowel habits or change in bladder habits, change in stools or change in urine, dysuria, hematuria,  rash, arthralgias, visual complaints, headache, numbness, weakness or ataxia or problems with walking or coordination,  change in mood or  memory.        Current Meds  Medication Sig   acetaminophen (TYLENOL) 500 MG tablet Take 500 mg by mouth daily as needed for moderate pain.   apixaban (ELIQUIS) 5 MG TABS tablet Take 5 mg by mouth 2 (two) times daily.   B Complex-C (B-COMPLEX WITH VITAMIN C) tablet Take 1 tablet by mouth daily.   Cholecalciferol (D3 PO) Take 1 tablet by mouth daily.   diclofenac (VOLTAREN) 75 MG EC tablet Take 75 mg by mouth 2 (two) times daily as needed.   magnesium oxide (MAG-OX) 400 (240 Mg) MG  tablet Take 400 mg by mouth daily.   Multiple Vitamin (MULTIVITAMIN) capsule Take 1 capsule by mouth daily.   Multiple Vitamins-Minerals (ZINC PO) Take 1 tablet by mouth daily.   pantoprazole (PROTONIX) 40 MG tablet Take 1 tablet (40 mg total) by mouth daily before breakfast.   rosuvastatin (CRESTOR) 5 MG tablet Take 5 mg by mouth daily.                Past Medical History:  Diagnosis Date   GERD (gastroesophageal reflux disease)    Hypercholesteremia    Osteoarthritis    Varicose veins      Objective:     wts      06/11/2022         239   05/28/22 240 lb (108.9 kg)  10/28/19 245 lb 8 oz (111.4 kg)  01/22/19 239 lb (108.4 kg)    Vital signs reviewed  06/11/2022  - Note at rest 02 sats  97% on RA   General appearance:    pleasantly verbose amb mod obese  wm nad       HEENT : Oropharynx  clear         NECK :  without  apparent JVD/ palpable Nodes/TM    LUNGS: no acc muscle use,  Nl contour chest which is clear to A and P bilaterally without cough on insp or exp maneuvers   CV:  RRR  no s3 or murmur or increase in P2, and no edema   ABD:  tensely obese soft and nontender with nl inspiratory excursion in the supine position. No bruits or organomegaly appreciated   MS:  Nl gait/ ext warm without deformities Or obvious joint restrictions  calf tenderness, cyanosis or clubbing    SKIN: warm and dry without lesions    NEURO:  alert, approp, nl sensorium with  no motor or cerebellar deficits apparent.       Assessment

## 2022-06-11 ENCOUNTER — Ambulatory Visit (INDEPENDENT_AMBULATORY_CARE_PROVIDER_SITE_OTHER): Payer: Medicare Other | Admitting: Internal Medicine

## 2022-06-11 ENCOUNTER — Encounter: Payer: Self-pay | Admitting: Internal Medicine

## 2022-06-11 VITALS — BP 144/80 | HR 80 | Ht 74.0 in | Wt 239.0 lb

## 2022-06-11 DIAGNOSIS — R058 Other specified cough: Secondary | ICD-10-CM | POA: Diagnosis not present

## 2022-06-11 MED ORDER — FAMOTIDINE 20 MG PO TABS
ORAL_TABLET | ORAL | Status: AC
Start: 2022-06-11 — End: ?

## 2022-06-11 NOTE — Patient Instructions (Addendum)
Continue Pantoprazole (protonix) 40 mg   Take  30-60 min before first meal of the day and Pepcid (famotidine)  20 mg after supper until return to office in 3 months  Please schedule a follow up visit in 3 months but call sooner if needed   Late add: sinus CT due to R upper molar pain despite 10 d augmentin and recurrent purulent "bronchtis"

## 2022-06-11 NOTE — Assessment & Plan Note (Signed)
Onset 1st of Jan 2024 but recurrent since teenage years once per year - cyclical cough rx plus augmentin x 10 days in case of occult sinus dz - Allergy screen 05/28/2022 >  Eos 0. 1/  IgE  16 - 06/11/2022 R Upper molar pain > sinus CT ordered   Has been told by dentist he has sinus problems related to R upper molar which is hurting again off augmentin though no cough or recurrent purulent sputum but if this is a molar problem will need more than prn abx so rec sinus Ct/ continue gerd rx to prevent cyclical coughing and f/u q 3 m, sooner prn with ent/ dental input in meantime if flares.          Each maintenance medication was reviewed in detail including emphasizing most importantly the difference between maintenance and prns and under what circumstances the prns are to be triggered using an action plan format where appropriate.  Total time for H and P, chart review, counseling,  and generating customized AVS unique to this office visit / same day charting  > 30 min for recurrent cough ? Etiology

## 2022-06-11 NOTE — Addendum Note (Signed)
Addended by: Sandrea Hughs B on: 06/11/2022 02:58 PM   Modules accepted: Orders

## 2022-06-17 ENCOUNTER — Telehealth: Payer: Self-pay | Admitting: Internal Medicine

## 2022-06-17 ENCOUNTER — Other Ambulatory Visit: Payer: Self-pay

## 2022-06-17 MED ORDER — AZITHROMYCIN 250 MG PO TABS: ORAL_TABLET | ORAL | 0 refills | Status: AC

## 2022-06-17 MED ORDER — PREDNISONE 10 MG PO TABS: ORAL_TABLET | ORAL | 0 refills | Status: DC

## 2022-06-17 NOTE — Telephone Encounter (Signed)
Patient complains of productive cough with green phlegm, sob, nauseous. Patient states symptoms started again on Wednesday. Dr. Sherene Sires please advise  Patient is requesting prednisone and Antibx  Pharmacy [facility] on [address]

## 2022-06-17 NOTE — Telephone Encounter (Signed)
Pt states he was feeling well after seeing Dr. Sherene Sires recently but now he is in need of Pred and Antibx. He would like Dr. Sherene Sires to call them in.  Pharm is [facility] on [address] (Last time we sent it to the wrong Pharm.)  PT is SOB and sick to his stomach. Lt green phlegm.  [phone] is a good #

## 2022-06-17 NOTE — Telephone Encounter (Signed)
Antibiotic and prednisone have been sent to pharmacy. Advised pt to call back Friday for apt if no improvement. NFN

## 2022-06-17 NOTE — Telephone Encounter (Signed)
Zpak Prednisone 10 mg take  4 each am x 2 days,   2 each am x 2 days,  1 each am x 2 days and stop   Ov if not improving by Friday the 19th

## 2022-06-18 ENCOUNTER — Other Ambulatory Visit: Payer: Self-pay

## 2022-06-18 ENCOUNTER — Emergency Department (HOSPITAL_COMMUNITY)
Admission: EM | Admit: 2022-06-18 | Discharge: 2022-06-18 | Disposition: A | Discharge status: Home / Self Care | Payer: Medicare Other | Attending: Emergency Medicine | Admitting: Emergency Medicine

## 2022-06-18 ENCOUNTER — Emergency Department (HOSPITAL_COMMUNITY): Payer: Medicare Other

## 2022-06-18 ENCOUNTER — Encounter (HOSPITAL_COMMUNITY): Payer: Self-pay

## 2022-06-18 DIAGNOSIS — R600 Localized edema: Secondary | ICD-10-CM | POA: Diagnosis not present

## 2022-06-18 DIAGNOSIS — R059 Cough, unspecified: Secondary | ICD-10-CM | POA: Diagnosis not present

## 2022-06-18 DIAGNOSIS — U071 COVID-19: Secondary | ICD-10-CM | POA: Insufficient documentation

## 2022-06-18 DIAGNOSIS — R0602 Shortness of breath: Secondary | ICD-10-CM | POA: Diagnosis not present

## 2022-06-18 DIAGNOSIS — K219 Gastro-esophageal reflux disease without esophagitis: Secondary | ICD-10-CM | POA: Diagnosis not present

## 2022-06-18 DIAGNOSIS — R079 Chest pain, unspecified: Secondary | ICD-10-CM | POA: Diagnosis not present

## 2022-06-18 DIAGNOSIS — M179 Osteoarthritis of knee, unspecified: Secondary | ICD-10-CM | POA: Diagnosis not present

## 2022-06-18 LAB — COMPREHENSIVE METABOLIC PANEL
ALT: 30 U/L (ref 0–44)
AST: 32 U/L (ref 15–41)
Albumin: 3.9 g/dL (ref 3.5–5.0)
Alkaline Phosphatase: 59 U/L (ref 38–126)
Anion gap: 10 (ref 5–15)
BUN: 11 mg/dL (ref 8–23)
CO2: 25 mmol/L (ref 22–32)
Calcium: 8.6 mg/dL — ABNORMAL LOW (ref 8.9–10.3)
Chloride: 97 mmol/L — ABNORMAL LOW (ref 98–111)
Creatinine, Ser: 0.73 mg/dL (ref 0.61–1.24)
GFR, Estimated: >60 mL/min (ref >60)
Glucose, Bld: 124 mg/dL — ABNORMAL HIGH (ref 70–99)
Potassium: 3.5 mmol/L (ref 3.5–5.1)
Sodium: 132 mmol/L — ABNORMAL LOW (ref 135–145)
Total Bilirubin: 0.9 mg/dL (ref 0.3–1.2)
Total Protein: 7.4 g/dL (ref 6.5–8.1)

## 2022-06-18 LAB — CBC WITH DIFFERENTIAL/PLATELET
Abs Immature Granulocytes: 0.02 10*3/uL (ref 0.00–0.07)
Basophils Absolute: 0 10*3/uL (ref 0.0–0.1)
Basophils Relative: 0 %
Eosinophils Absolute: 0.1 10*3/uL (ref 0.0–0.5)
Eosinophils Relative: 2 %
HCT: 42.6 % (ref 39.0–52.0)
Hemoglobin: 14.4 g/dL (ref 13.0–17.0)
Immature Granulocytes: 0 %
Lymphocytes Relative: 23 %
Lymphs Abs: 2 10*3/uL (ref 0.7–4.0)
MCH: 31.5 pg (ref 26.0–34.0)
MCHC: 33.8 g/dL (ref 30.0–36.0)
MCV: 93.2 fL (ref 80.0–100.0)
Monocytes Absolute: 0.8 10*3/uL (ref 0.1–1.0)
Monocytes Relative: 10 %
Neutro Abs: 5.4 10*3/uL (ref 1.7–7.7)
Neutrophils Relative %: 65 %
Platelets: 144 10*3/uL — ABNORMAL LOW (ref 150–400)
RBC: 4.57 MIL/uL (ref 4.22–5.81)
RDW: 13.1 % (ref 11.5–15.5)
WBC: 8.4 10*3/uL (ref 4.0–10.5)
nRBC: 0 % (ref 0.0–0.2)

## 2022-06-18 LAB — RESP PANEL BY RT-PCR (RSV, FLU A&B, COVID)  RVPGX2
Influenza A by PCR: NEGATIVE
Influenza B by PCR: NEGATIVE
Resp Syncytial Virus by PCR: POSITIVE — AB
SARS Coronavirus 2 by RT PCR: POSITIVE — AB

## 2022-06-18 LAB — TROPONIN I (HIGH SENSITIVITY): Troponin I (High Sensitivity): 5 ng/L (ref <18)

## 2022-06-18 LAB — BRAIN NATRIURETIC PEPTIDE: B Natriuretic Peptide: 37 pg/mL (ref 0.0–100.0)

## 2022-06-18 MED ORDER — IOHEXOL 350 MG/ML SOLN
100.0000 mL | Freq: Once | INTRAVENOUS | Status: AC | PRN
  Administered 2022-06-18: 100 mL via INTRAVENOUS

## 2022-06-18 MED ORDER — MORPHINE SULFATE (PF) 4 MG/ML IV SOLN
4.0000 mg | Freq: Once | INTRAVENOUS | Status: AC
  Administered 2022-06-18: 4 mg via INTRAVENOUS
  Filled 2022-06-18 (×2): qty 1

## 2022-06-18 MED ORDER — ALBUTEROL SULFATE HFA 108 (90 BASE) MCG/ACT IN AERS
INHALATION_SPRAY | RESPIRATORY_TRACT | Status: AC
  Filled 2022-06-18: qty 6.7

## 2022-06-18 MED ORDER — ONDANSETRON HCL 4 MG/2ML IJ SOLN
4.0000 mg | Freq: Once | INTRAMUSCULAR | Status: AC
  Administered 2022-06-18: 4 mg via INTRAVENOUS
  Filled 2022-06-18 (×2): qty 2

## 2022-06-18 MED ORDER — MOLNUPIRAVIR EUA 200MG CAPSULE: 4.0000 | ORAL_CAPSULE | Freq: Two times a day (BID) | ORAL | 0 refills | Status: AC

## 2022-06-18 MED ORDER — ALBUTEROL SULFATE HFA 108 (90 BASE) MCG/ACT IN AERS: 2.0000 | INHALATION_SPRAY | RESPIRATORY_TRACT | Status: DC

## 2022-06-18 NOTE — ED Provider Notes (Signed)
[place] EMERGENCY DEPARTMENT AT [facility] Provider Note   CSN: [id number] Arrival date & time: 06/18/22  0030     History  Chief Complaint  Patient presents with   Cough   Shortness of Breath    Geoffrey [name] is a 67 y.o. male.  This patient is a 67 year old male with past medical history of prior DVT, hyperlipidemia, GERD.  Patient was diagnosed with COVID in January.  Since that time, he reports a chronic cough and wheezing.  He was seen by his primary doctor multiple times, then referred to pulmonology.  He has seen Dr. Sherene Sires in the pulmonology clinic.  He was recently prescribed Zithromax and prednisone which he has been taking.  This evening, he started with pain in the center of his chest along with worsening cough.  The pain is worse when he coughs and attempts to lie flat.  He reports shortness of breath.  He denies any fevers or chills.  He denies history of coronary artery disease or heart issues.  The history is provided by the patient.       Home Medications Prior to Admission medications   Medication Sig Start Date End Date Taking? Authorizing Provider  azithromycin (ZITHROMAX) 250 MG tablet Take as directed 06/17/22   Nyoka Cowden, MD  predniSONE (DELTASONE) 10 MG tablet 4 each am x 2 days,   2 each am x 2 days,  1 each am x 2 days and stop 06/17/22   Nyoka Cowden, MD  acetaminophen (TYLENOL) 500 MG tablet Take 500 mg by mouth daily as needed for moderate pain.    [provider]  apixaban (ELIQUIS) 5 MG TABS tablet Take 5 mg by mouth 2 (two) times daily.    [provider]  B Complex-C (B-COMPLEX WITH VITAMIN C) tablet Take 1 tablet by mouth daily.    [provider]  Cholecalciferol (D3 PO) Take 1 tablet by mouth daily.    [provider]  diclofenac (VOLTAREN) 75 MG EC tablet Take 75 mg by mouth 2 (two) times daily as needed. 10/14/19   [provider]  famotidine (PEPCID) 20 MG tablet One after supper  06/11/22   Nyoka Cowden, MD  magnesium oxide (MAG-OX) 400 (240 Mg) MG tablet Take 400 mg by mouth daily.    [provider]  Multiple Vitamin (MULTIVITAMIN) capsule Take 1 capsule by mouth daily.    [provider]  Multiple Vitamins-Minerals (ZINC PO) Take 1 tablet by mouth daily.    [provider]  pantoprazole (PROTONIX) 40 MG tablet Take 1 tablet (40 mg total) by mouth daily before breakfast. 04/27/18   Tiffany Kocher, PA-C  rosuvastatin (CRESTOR) 5 MG tablet Take 5 mg by mouth daily.    [provider]      Allergies    Patient has no known allergies.    Review of Systems   Review of Systems  All other systems reviewed and are negative.   Physical Exam Updated Vital Signs BP 126/77   Pulse (!) 108   Temp 98.3 F (36.8 C)   Resp (!) 25   SpO2 94%  Physical Exam Vitals and nursing note reviewed.  Constitutional:      General: He is not in acute distress.    Appearance: He is well-developed. He is not diaphoretic.  HENT:     Head: Normocephalic and atraumatic.  Cardiovascular:     Rate and Rhythm: Normal rate and regular rhythm.  Heart sounds: No murmur heard.    No friction rub.  Pulmonary:     Effort: Pulmonary effort is normal. No respiratory distress.     Breath sounds: Normal breath sounds. No wheezing or rales.  Abdominal:     General: Bowel sounds are normal. There is no distension.     Palpations: Abdomen is soft.     Tenderness: There is no abdominal tenderness.  Musculoskeletal:        General: Normal range of motion.     Cervical back: Normal range of motion and neck supple.     Right lower leg: Edema present.     Left lower leg: Edema present.     Comments: There is 1+ pitting edema both lower extremities.  Skin:    General: Skin is warm and dry.  Neurological:     Mental Status: He is alert and oriented to person, place, and time.     Coordination: Coordination normal.     ED Results / Procedures /  Treatments   Labs (all labs ordered are listed, but only abnormal results are displayed) Labs Reviewed - No data to display  EKG EKG Interpretation  Date/Time:  Tuesday June 18 2022 00:48:51 EDT Ventricular Rate:  103 PR Interval:  134 QRS Duration: 86 QT Interval:  323 QTC Calculation: 423 R Axis:   55 Text Interpretation: Sinus tachycardia Otherwise normal ECG Confirmed by Geoffrey [name] ([id number]) on 06/18/2022 12:58:43 AM  Radiology No results found.  Procedures Procedures    Medications Ordered in ED Medications  ondansetron (ZOFRAN) injection 4 mg (has no administration in time range)  morphine (PF) 4 MG/ML injection 4 mg (has no administration in time range)    ED Course/ Medical Decision Making/ A&P  Patient is a 67 year old male presenting with cough and burning in his chest.  This has been worsening over the past several days.  He was prescribed Zithromax and prednisone by Dr. Sherene Sires.  Patient arrives here with stable vital signs and no hypoxia.  Physical examination basically unremarkable.  Workup initiated including CBC, metabolic panel, BNP, and troponin.  All studies essentially unremarkable.  COVID and RSV are positive, but flu is negative.  Patient was sent for a CTA of the chest which showed atypical pneumonia, but no evidence for PE.  Patient was given morphine for pain and Zofran for nausea and seems to be feeling somewhat better.  At this point, I feel as though patient can safely be discharged.  He has COVID and RSV, but no hypoxia or other emergent pathology identified in the workup.  I highly doubt a cardiac etiology.  Patient to be discharged with molnupiravir and follow-up as needed.  Final Clinical Impression(s) / ED Diagnoses Final diagnoses:  None    Rx / DC Orders ED Discharge Orders     None         Geoffrey Lyons, MD 06/18/22 [phone]

## 2022-06-18 NOTE — Discharge Instructions (Signed)
Begin taking molnupiravir as prescribed.  Continue other medications as previously prescribed.  Return to the emergency department if you develop worsening pain, worsening breathing, or for other new and concerning symptoms.

## 2022-06-18 NOTE — ED Triage Notes (Signed)
Pt presents with chest pain, cough and SOB. States that it has been going on since he was diagnosed with covid back in january. Pt seen multiple times for same thing. States started having chest pain today.

## 2022-06-18 NOTE — ED Notes (Signed)
Pt's O2 ranging 86-88% on RA after pain medication administration, see MAR. Pt placed on 2lpm [place], O2 increased to 94%.

## 2022-06-19 ENCOUNTER — Telehealth: Payer: Self-pay | Admitting: *Deleted

## 2022-06-19 NOTE — Telephone Encounter (Signed)
Called and spoke w/ pt he verbalized that he has been taking the medication prescribed by MW on 4/15. He went to ED yesterday and tested positive for COVID and RSV - ED doctor called in molnupiravir (he was unable to afford the medication so he called PCP)   PCP provided him w/ paxlovid he has taken 2 doses.   Dr.Wert do you have any other recommendations for pt?

## 2022-06-19 NOTE — Telephone Encounter (Signed)
No change in recs/ ok to take all the zpak and pred already rec prior to dx

## 2022-06-19 NOTE — Telephone Encounter (Signed)
Patient called and states that he was just recently Discharged from ED and has tested positive for COVID, RSV and on his mychart states that he is also positive for pneumonia but is not being treated for pneumonia and would like to discuss treatment options with Dr. Sherene Sires.  Please call and advise patient [phone]

## 2022-06-19 NOTE — Telephone Encounter (Signed)
Called and spoke to patient and went over recommendations from Dr Sherene Sires. Nothing further needed

## 2022-06-20 NOTE — Telephone Encounter (Signed)
Meds were called in for pt 4/15. Closing encounter.

## 2022-06-24 DIAGNOSIS — R0981 Nasal congestion: Secondary | ICD-10-CM | POA: Diagnosis not present

## 2022-06-24 DIAGNOSIS — U071 COVID-19: Secondary | ICD-10-CM | POA: Diagnosis not present

## 2022-06-24 DIAGNOSIS — Z299 Encounter for prophylactic measures, unspecified: Secondary | ICD-10-CM | POA: Diagnosis not present

## 2022-06-28 ENCOUNTER — Ambulatory Visit (HOSPITAL_COMMUNITY)
Admission: RE | Admit: 2022-06-28 | Discharge: 2022-06-28 | Disposition: A | Discharge status: Home / Self Care | Payer: Medicare Other | Source: Ambulatory Visit | Attending: Internal Medicine | Admitting: Internal Medicine

## 2022-06-28 DIAGNOSIS — R058 Other specified cough: Secondary | ICD-10-CM | POA: Diagnosis not present

## 2022-06-28 DIAGNOSIS — J3489 Other specified disorders of nose and nasal sinuses: Secondary | ICD-10-CM | POA: Diagnosis not present

## 2022-07-01 DIAGNOSIS — M7989 Other specified soft tissue disorders: Secondary | ICD-10-CM | POA: Diagnosis not present

## 2022-07-01 DIAGNOSIS — I83811 Varicose veins of right lower extremities with pain: Secondary | ICD-10-CM | POA: Diagnosis not present

## 2022-07-01 NOTE — Progress Notes (Signed)
Pt seen results via MyChart - NFN

## 2022-07-09 ENCOUNTER — Telehealth: Payer: Self-pay | Admitting: *Deleted

## 2022-07-09 NOTE — Telephone Encounter (Signed)
Patient called and would like to go over results of CT scan from 06/28/22  Please call and advise patient. [phone]

## 2022-07-10 ENCOUNTER — Other Ambulatory Visit: Payer: Self-pay

## 2022-07-10 MED ORDER — AMOXICILLIN-POT CLAVULANATE 875-125 MG PO TABS: 1.0000 | ORAL_TABLET | Freq: Two times a day (BID) | ORAL | 0 refills | Status: AC

## 2022-07-10 NOTE — Telephone Encounter (Signed)
Called and spoke w/ pt provided him the results per MW. Augmentin was sent in to preferred pharmacy and pt verbalized understanding. NFN att.

## 2022-07-21 IMAGING — US US EXTREM LOW VENOUS*R*
1 series · 14 of 24 positions shown · non-contrast
Comparison: None.

CLINICAL DATA: Rule out DVT.  Recent knee surgery.

EXAM:
RIGHT LOWER EXTREMITY VENOUS DOPPLER ULTRASOUND
TECHNIQUE: Gray-scale sonography with compression, as well as color and duplex
ultrasound, were performed to evaluate the deep venous system(s)
from the level of the common femoral vein through the popliteal and
proximal calf veins.

[Series 1: us venous img lower uni right (dvt) · portal-venous · 14 of 33 slices shown]
[im 1/33]
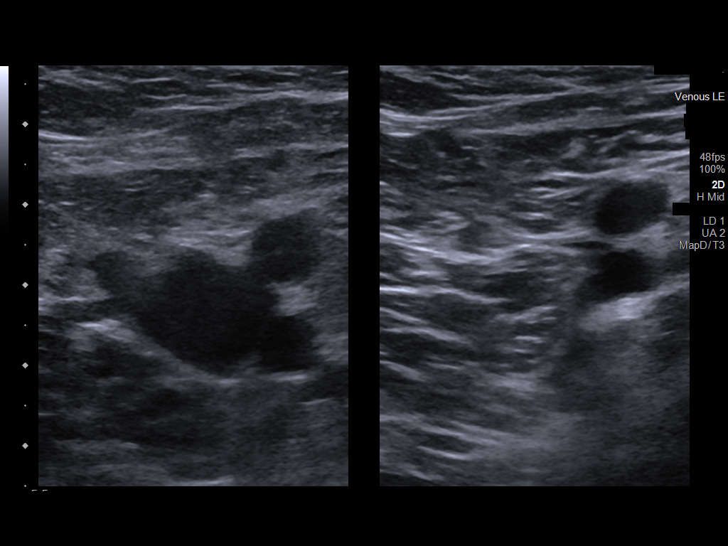
[im 3/33]
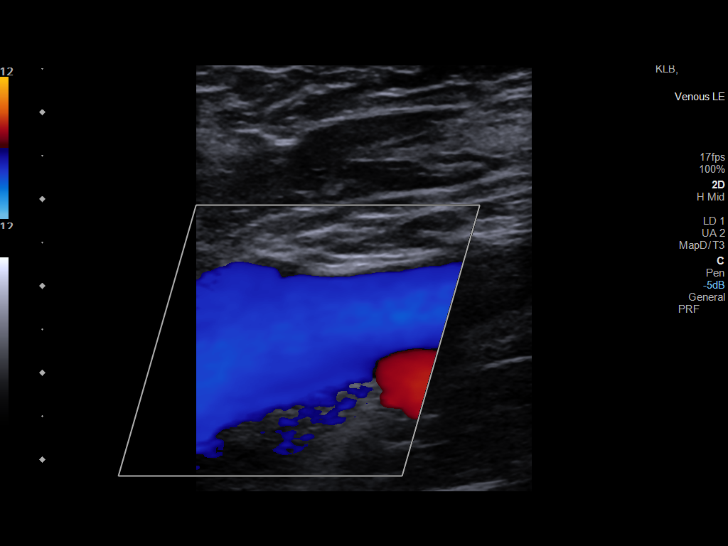
[im 6/33]
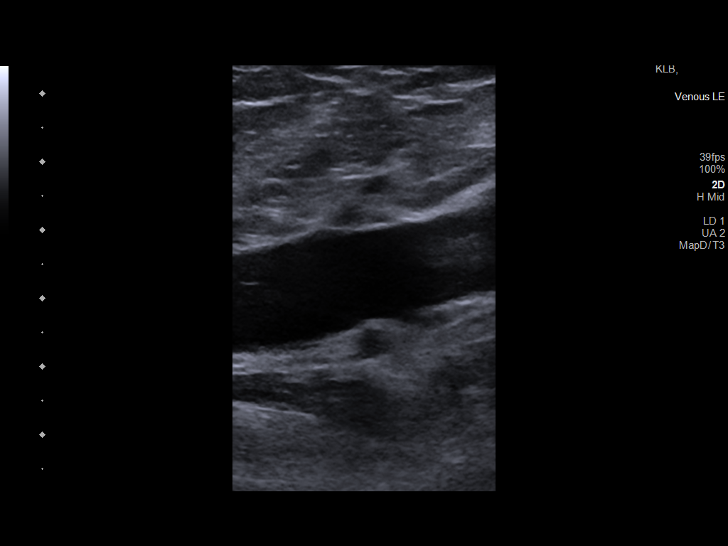
[im 9/33]
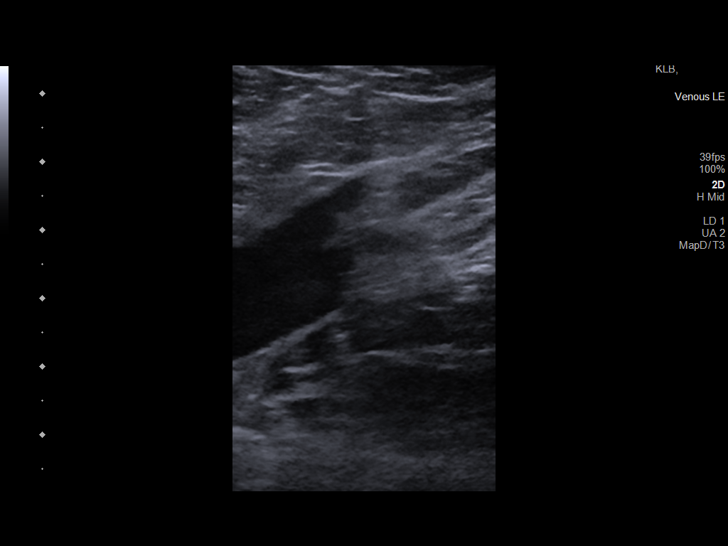
[im 10/33]
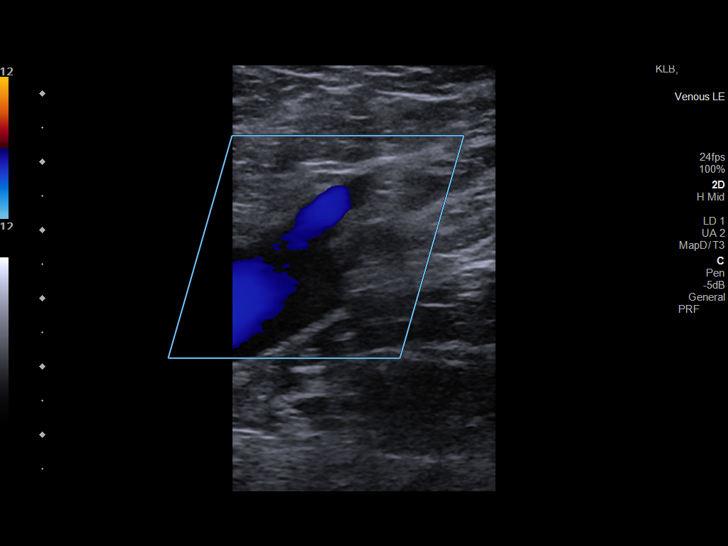
[im 13/33]
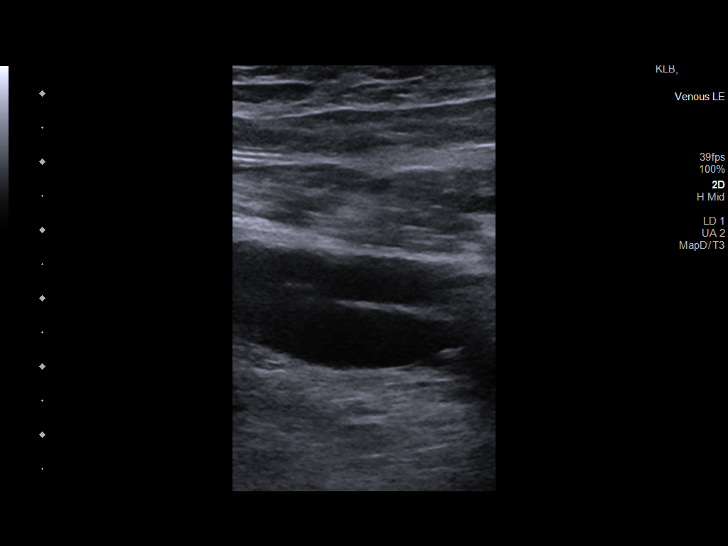
[im 16/33]
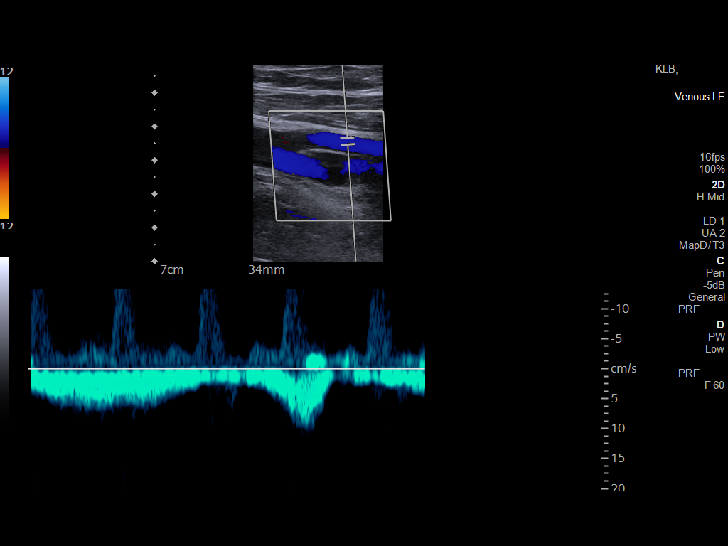
[im 17/33]
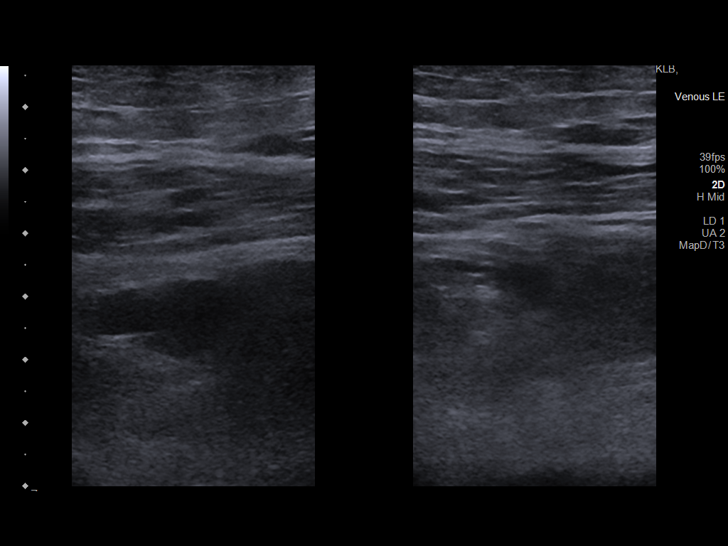
[im 20/33]
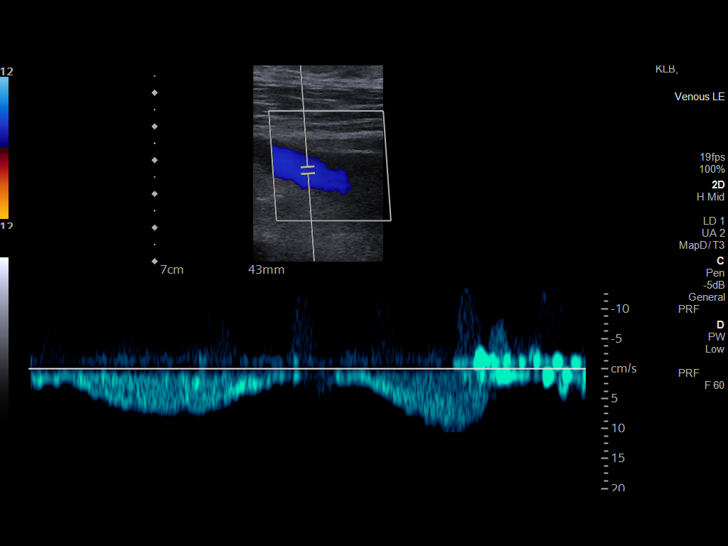
[im 23/33]
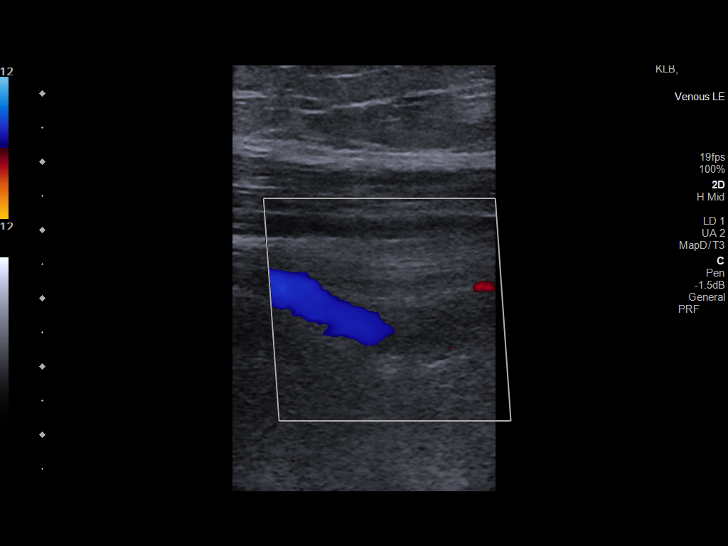
[im 26/33]
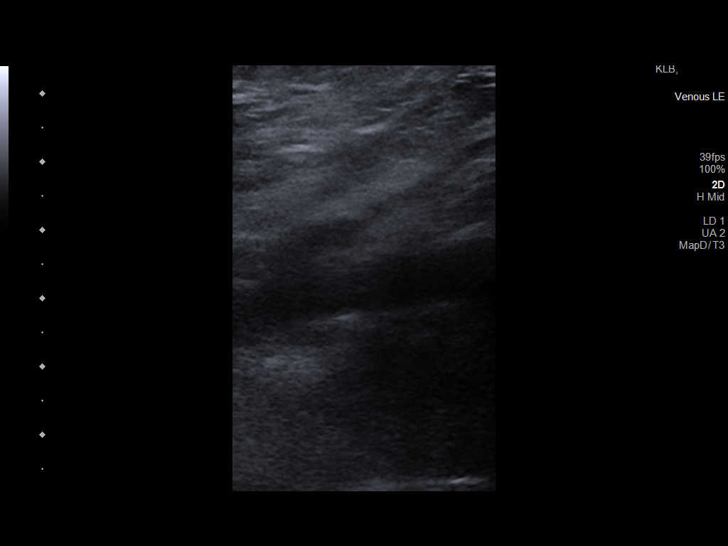
[im 27/33]
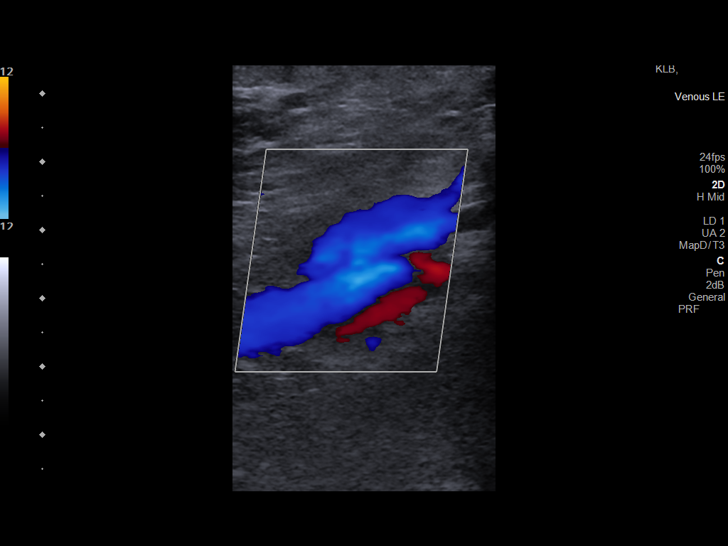
[im 30/33]
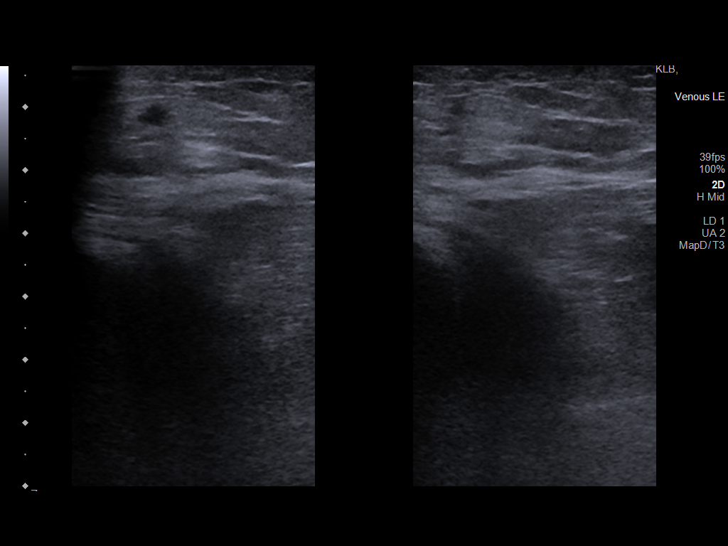
[im 33/33]
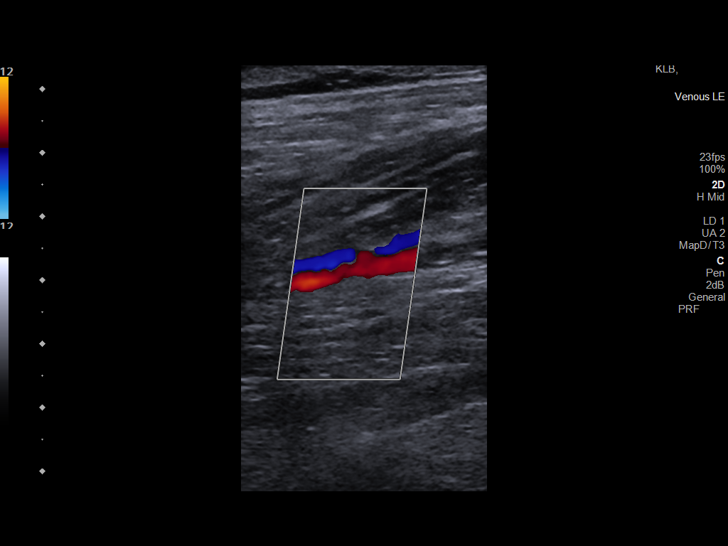

[14 of 24 positions shown; findings below may reference images not displayed]

FINDINGS: VENOUS

Normal compressibility of the common femoral, superficial femoral,
and popliteal veins, as well as the visualized calf veins.
Visualized portions of profunda femoral vein and great saphenous
vein unremarkable. No filling defects to suggest DVT on grayscale or
color Doppler imaging. Doppler waveforms show normal direction of
venous flow, normal respiratory plasticity and response to
augmentation.

Limited views of the contralateral common femoral vein are
unremarkable.
IMPRESSION: Negative for DVT.

## 2022-08-06 DIAGNOSIS — G2581 Restless legs syndrome: Secondary | ICD-10-CM | POA: Diagnosis not present

## 2022-08-06 DIAGNOSIS — L819 Disorder of pigmentation, unspecified: Secondary | ICD-10-CM | POA: Diagnosis not present

## 2022-08-06 DIAGNOSIS — R600 Localized edema: Secondary | ICD-10-CM | POA: Diagnosis not present

## 2022-08-06 DIAGNOSIS — I8311 Varicose veins of right lower extremity with inflammation: Secondary | ICD-10-CM | POA: Diagnosis not present

## 2022-08-06 DIAGNOSIS — I87391 Chronic venous hypertension (idiopathic) with other complications of right lower extremity: Secondary | ICD-10-CM | POA: Diagnosis not present

## 2022-08-08 DIAGNOSIS — M1712 Unilateral primary osteoarthritis, left knee: Secondary | ICD-10-CM | POA: Diagnosis not present

## 2022-08-22 DIAGNOSIS — L97909 Non-pressure chronic ulcer of unspecified part of unspecified lower leg with unspecified severity: Secondary | ICD-10-CM | POA: Diagnosis not present

## 2022-08-22 DIAGNOSIS — K219 Gastro-esophageal reflux disease without esophagitis: Secondary | ICD-10-CM | POA: Diagnosis not present

## 2022-08-22 DIAGNOSIS — Z79899 Other long term (current) drug therapy: Secondary | ICD-10-CM | POA: Diagnosis not present

## 2022-08-22 DIAGNOSIS — M179 Osteoarthritis of knee, unspecified: Secondary | ICD-10-CM | POA: Diagnosis not present

## 2022-08-22 DIAGNOSIS — Z0189 Encounter for other specified special examinations: Secondary | ICD-10-CM | POA: Diagnosis not present

## 2022-08-22 DIAGNOSIS — I809 Phlebitis and thrombophlebitis of unspecified site: Secondary | ICD-10-CM | POA: Diagnosis not present

## 2022-08-22 DIAGNOSIS — Z139 Encounter for screening, unspecified: Secondary | ICD-10-CM | POA: Diagnosis not present

## 2022-08-22 DIAGNOSIS — E7800 Pure hypercholesterolemia, unspecified: Secondary | ICD-10-CM | POA: Diagnosis not present

## 2022-09-10 DIAGNOSIS — Z139 Encounter for screening, unspecified: Secondary | ICD-10-CM | POA: Diagnosis not present

## 2022-09-12 DIAGNOSIS — L738 Other specified follicular disorders: Secondary | ICD-10-CM | POA: Diagnosis not present

## 2022-09-12 DIAGNOSIS — L821 Other seborrheic keratosis: Secondary | ICD-10-CM | POA: Diagnosis not present

## 2022-09-12 DIAGNOSIS — X32XXXD Exposure to sunlight, subsequent encounter: Secondary | ICD-10-CM | POA: Diagnosis not present

## 2022-09-12 DIAGNOSIS — L570 Actinic keratosis: Secondary | ICD-10-CM | POA: Diagnosis not present

## 2022-09-12 DIAGNOSIS — D2339 Other benign neoplasm of skin of other parts of face: Secondary | ICD-10-CM | POA: Diagnosis not present

## 2022-09-13 DIAGNOSIS — G629 Polyneuropathy, unspecified: Secondary | ICD-10-CM | POA: Diagnosis not present

## 2022-09-13 DIAGNOSIS — I803 Phlebitis and thrombophlebitis of lower extremities, unspecified: Secondary | ICD-10-CM | POA: Diagnosis not present

## 2022-09-13 DIAGNOSIS — E7800 Pure hypercholesterolemia, unspecified: Secondary | ICD-10-CM | POA: Diagnosis not present

## 2022-09-13 DIAGNOSIS — K219 Gastro-esophageal reflux disease without esophagitis: Secondary | ICD-10-CM | POA: Diagnosis not present

## 2022-09-13 DIAGNOSIS — I8390 Asymptomatic varicose veins of unspecified lower extremity: Secondary | ICD-10-CM | POA: Diagnosis not present

## 2022-09-13 DIAGNOSIS — R42 Dizziness and giddiness: Secondary | ICD-10-CM | POA: Diagnosis not present

## 2022-09-13 DIAGNOSIS — R748 Abnormal levels of other serum enzymes: Secondary | ICD-10-CM | POA: Diagnosis not present

## 2022-09-13 DIAGNOSIS — M179 Osteoarthritis of knee, unspecified: Secondary | ICD-10-CM | POA: Diagnosis not present

## 2022-09-13 DIAGNOSIS — L97909 Non-pressure chronic ulcer of unspecified part of unspecified lower leg with unspecified severity: Secondary | ICD-10-CM | POA: Diagnosis not present

## 2022-09-16 ENCOUNTER — Ambulatory Visit: Payer: BC Managed Care – PPO | Admitting: Internal Medicine

## 2022-10-10 DIAGNOSIS — R059 Cough, unspecified: Secondary | ICD-10-CM | POA: Diagnosis not present

## 2022-10-10 DIAGNOSIS — J209 Acute bronchitis, unspecified: Secondary | ICD-10-CM | POA: Diagnosis not present

## 2022-11-07 ENCOUNTER — Other Ambulatory Visit (HOSPITAL_COMMUNITY): Payer: Self-pay | Admitting: Radiology

## 2022-11-07 DIAGNOSIS — J42 Unspecified chronic bronchitis: Secondary | ICD-10-CM

## 2023-01-08 DIAGNOSIS — I809 Phlebitis and thrombophlebitis of unspecified site: Secondary | ICD-10-CM | POA: Diagnosis not present

## 2023-01-08 DIAGNOSIS — E7800 Pure hypercholesterolemia, unspecified: Secondary | ICD-10-CM | POA: Diagnosis not present

## 2023-01-08 DIAGNOSIS — R059 Cough, unspecified: Secondary | ICD-10-CM | POA: Diagnosis not present

## 2023-01-08 DIAGNOSIS — J42 Unspecified chronic bronchitis: Secondary | ICD-10-CM | POA: Diagnosis not present

## 2023-01-08 DIAGNOSIS — R7401 Elevation of levels of liver transaminase levels: Secondary | ICD-10-CM | POA: Diagnosis not present

## 2023-01-08 DIAGNOSIS — R7301 Impaired fasting glucose: Secondary | ICD-10-CM | POA: Diagnosis not present

## 2023-01-08 DIAGNOSIS — M179 Osteoarthritis of knee, unspecified: Secondary | ICD-10-CM | POA: Diagnosis not present

## 2023-01-08 DIAGNOSIS — K219 Gastro-esophageal reflux disease without esophagitis: Secondary | ICD-10-CM | POA: Diagnosis not present

## 2023-01-08 DIAGNOSIS — I8390 Asymptomatic varicose veins of unspecified lower extremity: Secondary | ICD-10-CM | POA: Diagnosis not present

## 2023-01-14 ENCOUNTER — Ambulatory Visit (HOSPITAL_COMMUNITY)
Admission: RE | Admit: 2023-01-14 | Discharge: 2023-01-14 | Disposition: A | Discharge status: Home / Self Care | Payer: Medicare Other | Source: Ambulatory Visit | Attending: Internal Medicine | Admitting: Internal Medicine

## 2023-01-14 DIAGNOSIS — J42 Unspecified chronic bronchitis: Secondary | ICD-10-CM | POA: Diagnosis not present

## 2023-01-14 DIAGNOSIS — J984 Other disorders of lung: Secondary | ICD-10-CM | POA: Diagnosis not present

## 2023-01-14 LAB — PULMONARY FUNCTION TEST
DL/VA % pred: 115 %
DL/VA: 4.62 ml/min/mmHg/L
DLCO unc % pred: 90 %
DLCO unc: 26.61 ml/min/mmHg
FEF 25-75 Post: 2.29 L/s
FEF 25-75 Pre: 2.01 L/s
FEF2575-%Change-Post: 14 %
FEF2575-%Pred-Post: 76 %
FEF2575-%Pred-Pre: 66 %
FEV1-%Change-Post: 2 %
FEV1-%Pred-Post: 77 %
FEV1-%Pred-Pre: 75 %
FEV1-Post: 3.03 L
FEV1-Pre: 2.95 L
FEV1FVC-%Change-Post: 0 %
FEV1FVC-%Pred-Pre: 99 %
FEV6-%Change-Post: 0 %
FEV6-%Pred-Post: 78 %
FEV6-%Pred-Pre: 77 %
FEV6-Post: 3.9 L
FEV6-Pre: 3.87 L
FEV6FVC-%Change-Post: -1 %
FEV6FVC-%Pred-Post: 100 %
FEV6FVC-%Pred-Pre: 101 %
FVC-%Change-Post: 2 %
FVC-%Pred-Post: 78 %
FVC-%Pred-Pre: 76 %
FVC-Post: 4.1 L
FVC-Pre: 4.01 L
Post FEV1/FVC ratio: 74 %
Post FEV6/FVC ratio: 95 %
Pre FEV1/FVC ratio: 74 %
Pre FEV6/FVC Ratio: 97 %
RV % pred: 233 %
RV: 6.1 L
TLC % pred: 130 %
TLC: 10.19 L

## 2023-01-14 MED ORDER — ALBUTEROL SULFATE (2.5 MG/3ML) 0.083% IN NEBU
2.5000 mg | INHALATION_SOLUTION | Freq: Once | RESPIRATORY_TRACT | Status: AC
  Administered 2023-01-14: 2.5 mg via RESPIRATORY_TRACT

## 2023-02-05 DIAGNOSIS — R600 Localized edema: Secondary | ICD-10-CM | POA: Diagnosis not present

## 2023-02-05 DIAGNOSIS — I83891 Varicose veins of right lower extremities with other complications: Secondary | ICD-10-CM | POA: Diagnosis not present

## 2023-02-05 DIAGNOSIS — I8311 Varicose veins of right lower extremity with inflammation: Secondary | ICD-10-CM | POA: Diagnosis not present

## 2023-02-05 DIAGNOSIS — I872 Venous insufficiency (chronic) (peripheral): Secondary | ICD-10-CM | POA: Diagnosis not present

## 2023-02-05 DIAGNOSIS — G2581 Restless legs syndrome: Secondary | ICD-10-CM | POA: Diagnosis not present

## 2023-03-04 ENCOUNTER — Ambulatory Visit
Admission: RE | Admit: 2023-03-04 | Discharge: 2023-03-04 | Disposition: A | Discharge status: Home / Self Care | Payer: Medicare Other | Source: Ambulatory Visit | Attending: Nurse Practitioner | Admitting: Nurse Practitioner

## 2023-03-04 VITALS — BP 133/88 | HR 100 | Temp 97.6°F | Resp 16

## 2023-03-04 DIAGNOSIS — J42 Unspecified chronic bronchitis: Secondary | ICD-10-CM | POA: Diagnosis not present

## 2023-03-04 MED ORDER — DOXYCYCLINE HYCLATE 100 MG PO TABS: 100.0000 mg | ORAL_TABLET | Freq: Two times a day (BID) | ORAL | 0 refills | Status: AC

## 2023-03-04 MED ORDER — PREDNISONE 20 MG PO TABS: 40.0000 mg | ORAL_TABLET | Freq: Every day | ORAL | 0 refills | Status: AC

## 2023-03-04 MED ORDER — PROMETHAZINE-DM 6.25-15 MG/5ML PO SYRP: 5.0000 mL | ORAL_SOLUTION | Freq: Four times a day (QID) | ORAL | 0 refills | Status: AC | PRN

## 2023-03-04 NOTE — ED Provider Notes (Signed)
 [facility]    CSN: [id number] Arrival date & time: 03/04/23  1151      History   Chief Complaint Chief Complaint  Patient presents with   Cough    Entered by patient    HPI Geoffrey [name] is a 67 y.o. male.   The history is provided by the patient.   Patient presents for complaints of cough, nasal congestion, and headache.  Patient states that the cough is chronic.  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  Patient states that cough is worse at night and in the morning, cough improves during the day.  He states that he takes Symbicort daily.  He also has an albuterol  inhaler.  Patient states that he has seen pulmonology in the past, now he is being treated by Dr. Shona.  Patient denies prior history of smoking.  Patient reports he has been taking several over-the-counter cough and cold medications for his symptoms with minimal relief.  Patient states that in the past, he has received an antibiotic along with prednisone  and cough medicine for his symptoms. 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 Oliva Altes, Versed 6mg /Demerol  75mg . normal exam. prep good. next tcs in 10 years.    HERNIA REPAIR  1993   Dr. Barry: umbilical hernia   KNEE SURGERY Right    arthroscopy   VARICOSE VEIN SURGERY Right        Home Medications    Prior to Admission medications   Medication Sig Start Date End Date Taking? Authorizing Provider  amoxicillin -clavulanate (AUGMENTIN ) 875-125 MG tablet Take 1 tablet by mouth 2 (two) times daily. 07/10/22   Darlean Ozell NOVAK, MD  azithromycin  (ZITHROMAX ) 250 MG tablet Take as directed 06/17/22   Wert, Michael B, MD  doxycycline  (VIBRA -TABS) 100 MG tablet Take 1 tablet (100 mg total) by mouth 2 (two) times daily for 5 days. 03/04/23 03/09/23 Yes Leath-Warren, Etta JINNY, NP  predniSONE  (DELTASONE ) 20 MG tablet Take 2 tablets (40 mg total) by mouth daily with breakfast for 5 days. 03/04/23 03/09/23 Yes Leath-Warren, Etta JINNY, NP  promethazine -dextromethorphan (PROMETHAZINE -DM) 6.25-15 MG/5ML syrup Take 5 mLs by mouth 4 (four) times daily as needed. 03/04/23  Yes Leath-Warren, Etta JINNY, NP  acetaminophen  (TYLENOL ) 500 MG tablet Take 500 mg by mouth daily as needed for moderate pain.    [provider]  apixaban (ELIQUIS) 5 MG TABS tablet Take 5 mg by mouth 2 (two) times daily.    [provider]  B Complex-C (B-COMPLEX WITH VITAMIN C) tablet Take 1 tablet by mouth daily.    [provider]  Cholecalciferol (D3 PO)  Take 1 tablet by mouth daily.    [provider]  diclofenac (VOLTAREN) 75 MG EC tablet Take 75 mg by mouth 2 (two) times daily as needed. 10/14/19   [provider]  famotidine  (PEPCID ) 20 MG tablet One after supper 06/11/22   Darlean Ozell NOVAK, MD  magnesium oxide (MAG-OX) 400 (240 Mg) MG tablet Take 400 mg by mouth daily.    [provider]  Multiple Vitamin (MULTIVITAMIN) capsule Take 1 capsule by mouth daily.    [provider]  Multiple Vitamins-Minerals (ZINC PO) Take 1 tablet by mouth daily.    [provider]   pantoprazole (PROTONIX) 40 MG tablet Take 1 tablet (40 mg total) by mouth daily before breakfast. 04/27/18   Ezzard Sonny RAMAN, PA-C  rosuvastatin (CRESTOR) 5 MG tablet Take 5 mg by mouth daily.    [provider]    Family History Family History  Problem Relation Age of Onset   Breast cancer Mother    Alzheimer's disease Mother    Diabetes Mother    Dementia Father    Diabetes Brother    Colon cancer Neg Hx     Social History Social History   Tobacco Use   Smoking status: Never   Smokeless tobacco: Never  Substance Use Topics   Alcohol use: Yes    Alcohol/week: 0.0 standard drinks of alcohol    Comment: occassional   Drug use: No     Allergies   Patient has no known allergies.   Review of Systems Review of Systems Per HPI  Physical Exam Triage Vital Signs ED Triage Vitals  Encounter Vitals Group     BP 03/04/23 1211 133/88     Systolic BP Percentile --      Diastolic BP Percentile --      Pulse Rate 03/04/23 1211 100     Resp 03/04/23 1211 16     Temp 03/04/23 1211 97.6 F (36.4 C)     Temp Source 03/04/23 1211 Oral     SpO2 03/04/23 1211 94 %     Weight --      Height --      Head Circumference --      Peak Flow --      Pain Score 03/04/23 1213 0     Pain Loc --      Pain Education --      Exclude from Growth Chart --    No data found.  Updated Vital Signs BP 133/88 (BP Location: Right Arm)   Pulse 100   Temp 97.6 F (36.4 C) (Oral)   Resp 16   SpO2 94%   Visual Acuity Right Eye Distance:   Left Eye Distance:   Bilateral Distance:    Right Eye Near:   Left Eye Near:    Bilateral Near:     Physical Exam Vitals and nursing note reviewed.  Constitutional:      General: He is not in acute distress.    Appearance: Normal appearance.  HENT:     Head: Normocephalic.     Right Ear: Tympanic membrane, ear canal and external ear normal.     Left Ear: Tympanic membrane, ear canal and external ear normal.     Nose: Congestion  present.     Right Turbinates: Enlarged and swollen.     Left Turbinates: Enlarged and swollen.     Right Sinus: No maxillary sinus tenderness or frontal sinus tenderness.     Left Sinus: No maxillary  sinus tenderness or frontal sinus tenderness.     Mouth/Throat:     Lips: Pink.     Mouth: Mucous membranes are moist.     Pharynx: Uvula midline. Postnasal drip present. No pharyngeal swelling or posterior oropharyngeal erythema.  Eyes:     Extraocular Movements: Extraocular movements intact.     Conjunctiva/sclera: Conjunctivae normal.     Pupils: Pupils are equal, round, and reactive to light.  Cardiovascular:     Rate and Rhythm: Normal rate and regular rhythm.     Pulses: Normal pulses.     Heart sounds: Normal heart sounds.  Pulmonary:     Effort: Pulmonary effort is normal. No respiratory distress.     Breath sounds: No stridor. Wheezing (faint expiratory wheezes noted in the posterior LUL) present. No rhonchi or rales.  Abdominal:     General: Bowel sounds are normal.     Palpations: Abdomen is soft.     Tenderness: There is no abdominal tenderness.  Musculoskeletal:     Cervical back: Normal range of motion.  Lymphadenopathy:     Cervical: No cervical adenopathy.  Skin:    General: Skin is warm and dry.  Neurological:     General: No focal deficit present.     Mental Status: He is alert and oriented to person, place, and time.  Psychiatric:        Mood and Affect: Mood normal.        Behavior: Behavior normal.      UC Treatments / Results  Labs (all labs ordered are listed, but only abnormal results are displayed) Labs Reviewed - No data to display  EKG   Radiology No results found.  Procedures Procedures (including critical care time)  Medications Ordered in UC Medications - No data to display  Initial Impression / Assessment and Plan / UC Course  I have reviewed the triage vital signs and the nursing notes.  Pertinent labs & imaging results that  were available during my care of the patient were reviewed by me and considered in my medical decision making (see chart for details).  On exam, patient with faint expiratory wheezing noted in the posterior left upper lobe, room air sats at 94%.  Patient is speaking in complete sentences, without difficulty.  Do suspect patient is experiencing a flare of his chronic bronchitis.  Given his chronic history, will treat accordingly with doxycycline  100 mg twice daily for the next 5 days, prednisone  40 mg, and Promethazine  DM for the cough.  Supportive care recommendations were provided and discussed with the patient to include fluids, rest, over-the-counter analgesics, and use of a humidifier during sleep.  Patient was advised to follow-up with Dr. Shona within the next 7 to 10 days for reevaluation.  Patient was in agreement with this plan of care and verbalizes understanding.  All questions were answered.  Patient stable for discharge.   Final Clinical Impressions(s) / UC Diagnoses   Final diagnoses:  Chronic bronchitis, unspecified chronic bronchitis type [facility])     Discharge Instructions      Take medication as prescribed.  Continue use of your albuterol  inhaler and Symbicort as previously prescribed. Increase fluids and allow for plenty of rest. Take over-the-counter Tylenol  as needed for pain, fever, or general discomfort. Recommend using a humidifier in your bedroom at nighttime during sleep and sleeping slightly elevated on pillows while symptoms persist. Please follow-up with Dr. Shona within the next 7 to 10 days for reevaluation. Follow-up as needed.  ED Prescriptions     Medication Sig Dispense Auth. Provider   doxycycline  (VIBRA -TABS) 100 MG tablet Take 1 tablet (100 mg total) by mouth 2 (two) times daily for 5 days. 10 tablet Leath-Warren, Etta PARAS, NP   predniSONE  (DELTASONE ) 20 MG tablet Take 2 tablets (40 mg total) by mouth daily with breakfast for 5 days. 10 tablet  Leath-Warren, Etta PARAS, NP   promethazine -dextromethorphan (PROMETHAZINE -DM) 6.25-15 MG/5ML syrup Take 5 mLs by mouth 4 (four) times daily as needed. 118 mL Leath-Warren, Etta PARAS, NP      PDMP not reviewed this encounter.   Gilmer Etta PARAS, NP 03/04/23 1240

## 2023-03-04 NOTE — ED Triage Notes (Signed)
Pt reports cough, nasal congestion, headache. states cough is the worse in the morning and evenings. It is manageable during the day x 5 days

## 2023-03-04 NOTE — Discharge Instructions (Signed)
 Take medication as prescribed.  Continue use of your albuterol  inhaler and Symbicort as previously prescribed. Increase fluids and allow for plenty of rest. Take over-the-counter Tylenol  as needed for pain, fever, or general discomfort. Recommend using a humidifier in your bedroom at nighttime during sleep and sleeping slightly elevated on pillows while symptoms persist. Please follow-up with Dr. Shona within the next 7 to 10 days for reevaluation. Follow-up as needed.

## 2023-04-07 DIAGNOSIS — I83891 Varicose veins of right lower extremities with other complications: Secondary | ICD-10-CM | POA: Diagnosis not present

## 2023-04-10 DIAGNOSIS — J42 Unspecified chronic bronchitis: Secondary | ICD-10-CM | POA: Diagnosis not present

## 2023-04-10 DIAGNOSIS — Z79899 Other long term (current) drug therapy: Secondary | ICD-10-CM | POA: Diagnosis not present

## 2023-04-10 DIAGNOSIS — R059 Cough, unspecified: Secondary | ICD-10-CM | POA: Diagnosis not present

## 2023-04-17 DIAGNOSIS — D225 Melanocytic nevi of trunk: Secondary | ICD-10-CM | POA: Diagnosis not present

## 2023-04-17 DIAGNOSIS — X32XXXD Exposure to sunlight, subsequent encounter: Secondary | ICD-10-CM | POA: Diagnosis not present

## 2023-04-17 DIAGNOSIS — L820 Inflamed seborrheic keratosis: Secondary | ICD-10-CM | POA: Diagnosis not present

## 2023-04-17 DIAGNOSIS — L570 Actinic keratosis: Secondary | ICD-10-CM | POA: Diagnosis not present

## 2023-04-21 DIAGNOSIS — I8311 Varicose veins of right lower extremity with inflammation: Secondary | ICD-10-CM | POA: Diagnosis not present

## 2023-04-21 DIAGNOSIS — I87391 Chronic venous hypertension (idiopathic) with other complications of right lower extremity: Secondary | ICD-10-CM | POA: Diagnosis not present

## 2023-04-21 DIAGNOSIS — G2581 Restless legs syndrome: Secondary | ICD-10-CM | POA: Diagnosis not present

## 2023-04-21 DIAGNOSIS — I83891 Varicose veins of right lower extremities with other complications: Secondary | ICD-10-CM | POA: Diagnosis not present

## 2023-04-21 DIAGNOSIS — R600 Localized edema: Secondary | ICD-10-CM | POA: Diagnosis not present

## 2023-04-21 IMAGING — US US EXTREM LOW VENOUS*R*
1 series · 14 of 24 positions shown · non-contrast
Comparison: January 25, 2020.

CLINICAL DATA: Right lower extremity swelling.

EXAM:
Right LOWER EXTREMITY VENOUS DOPPLER ULTRASOUND
TECHNIQUE: Gray-scale sonography with compression, as well as color and duplex
ultrasound, were performed to evaluate the deep venous system(s)
from the level of the common femoral vein through the popliteal and
proximal calf veins.

[Series 1: us venous img lower uni right (dvt) · portal-venous · 14 of 42 slices shown]
[im 1/42]
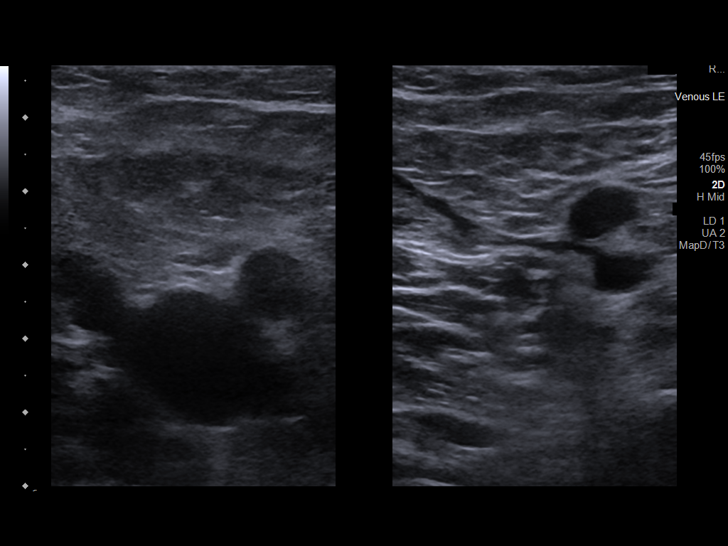
[im 4/42]
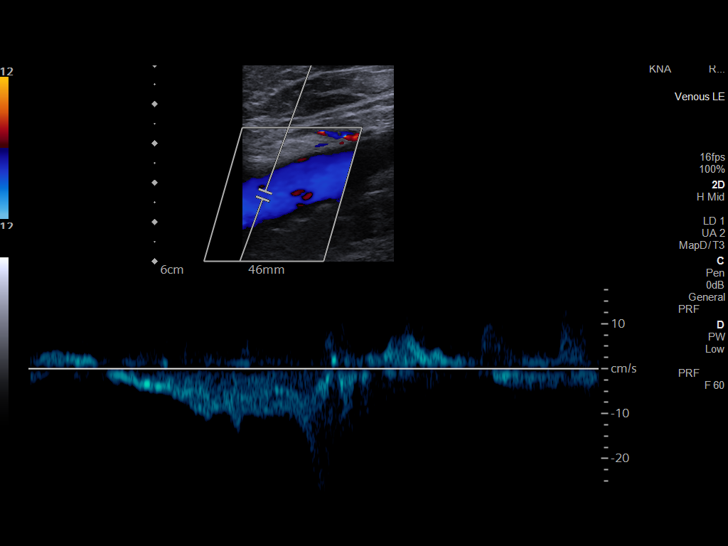
[im 8/42]
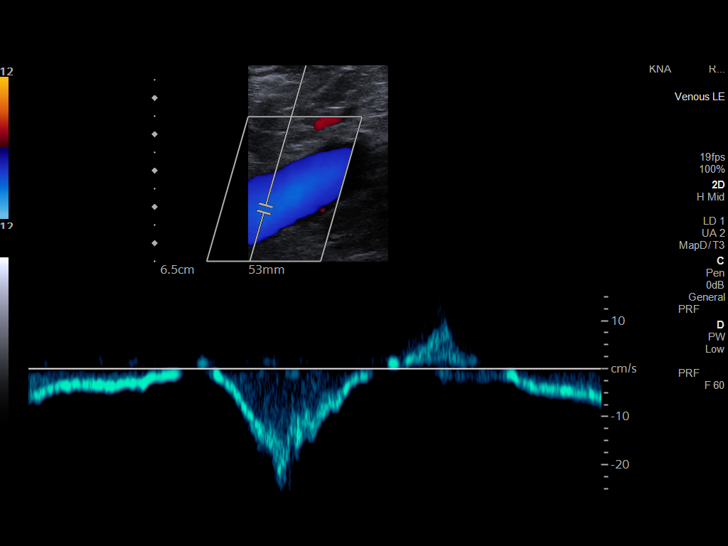
[im 11/42]
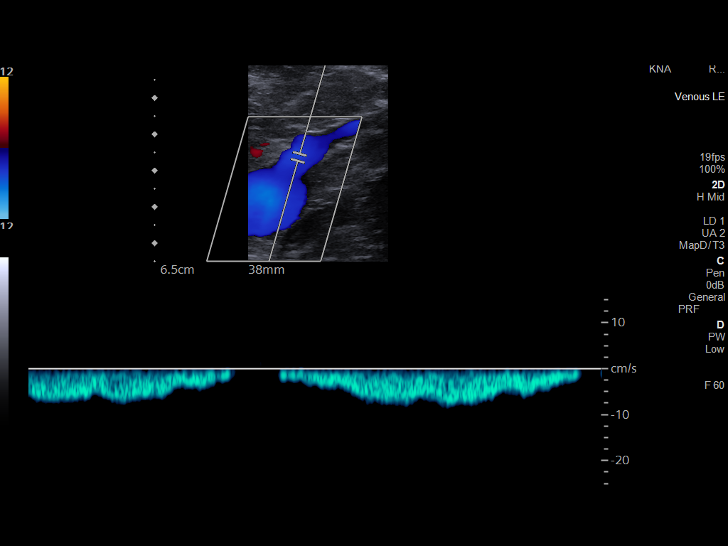
[im 13/42]
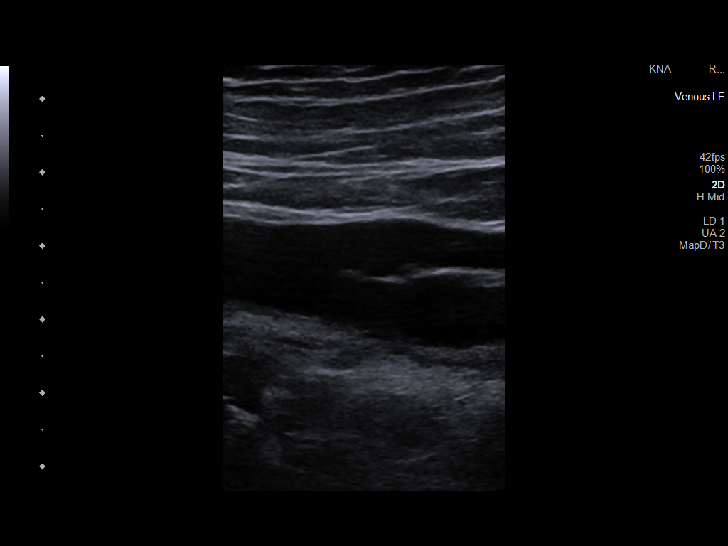
[im 17/42]
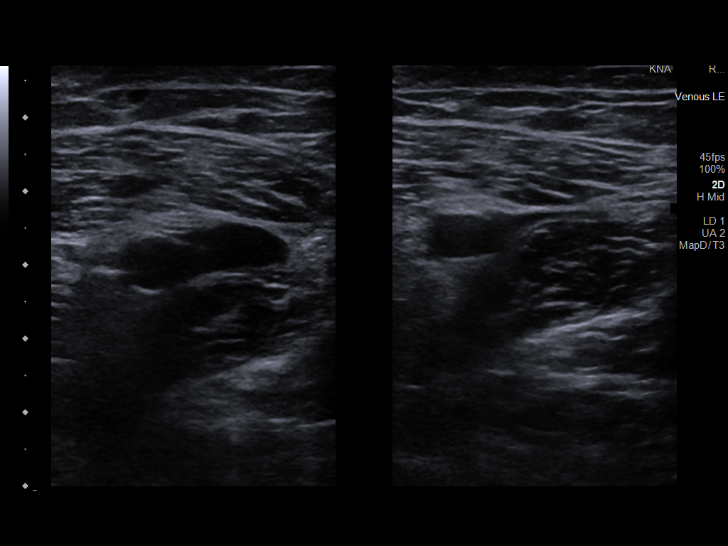
[im 20/42]
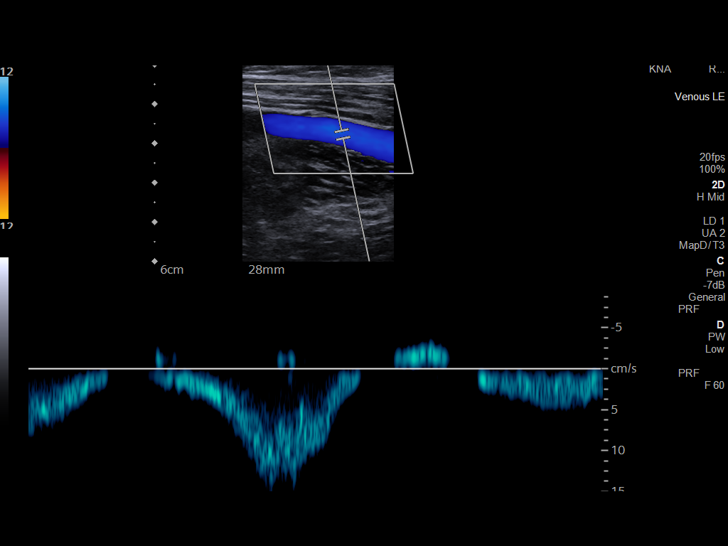
[im 22/42]
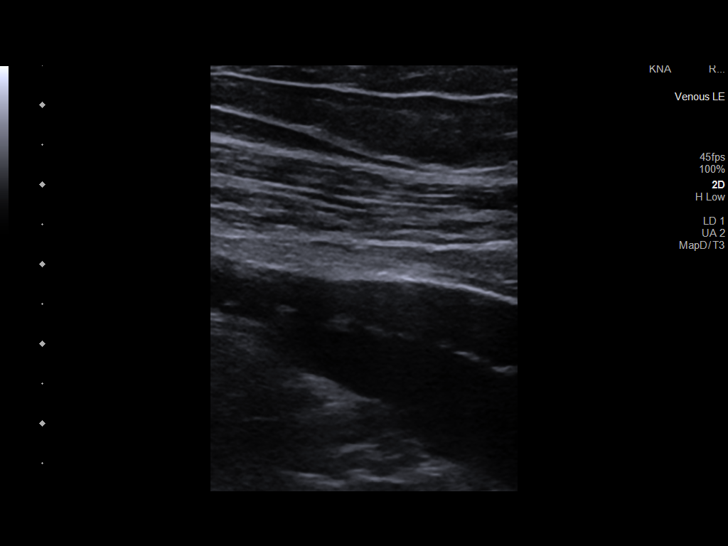
[im 25/42]
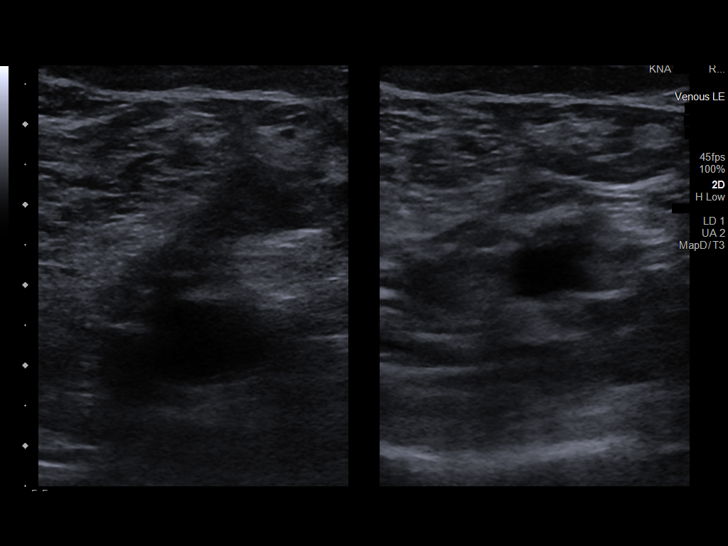
[im 29/42]
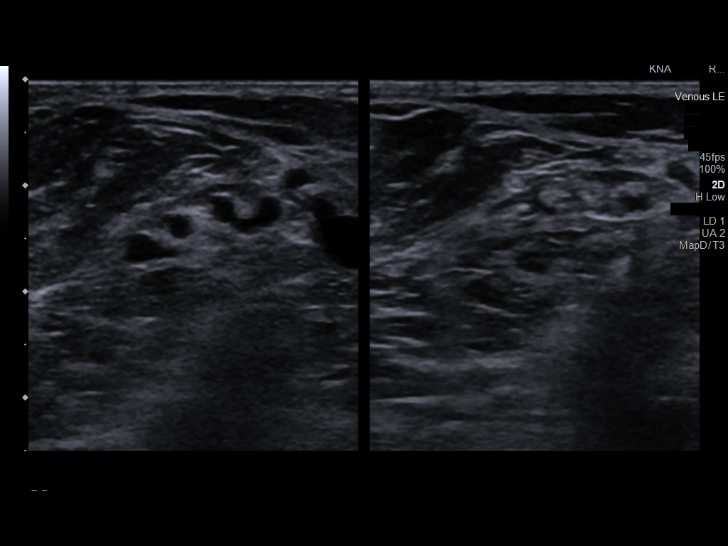
[im 33/42]
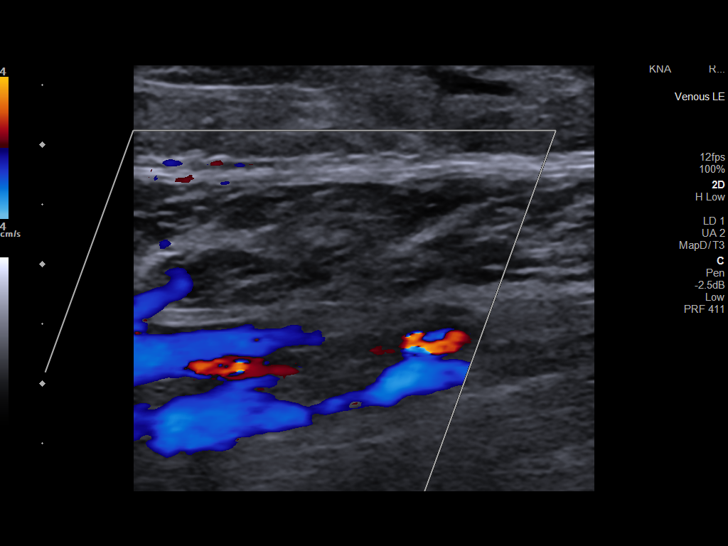
[im 34/42]
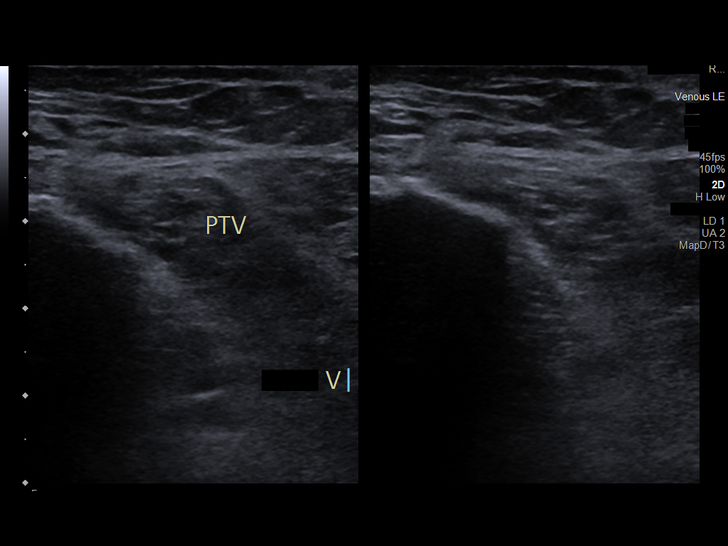
[im 38/42]
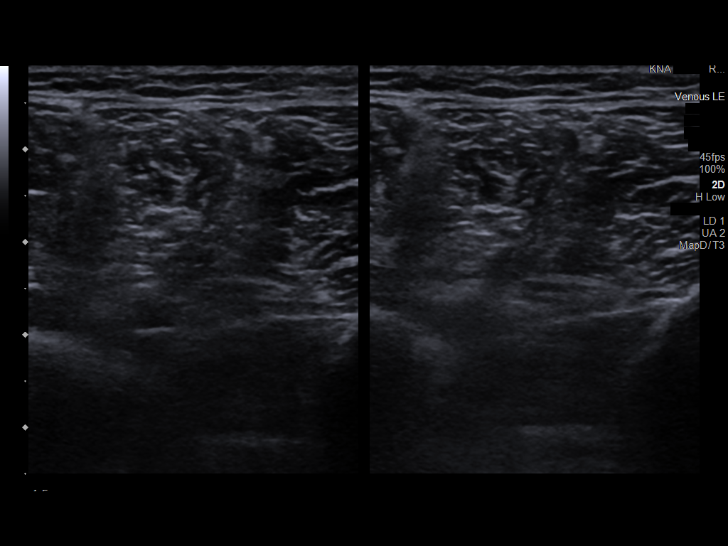
[im 42/42]
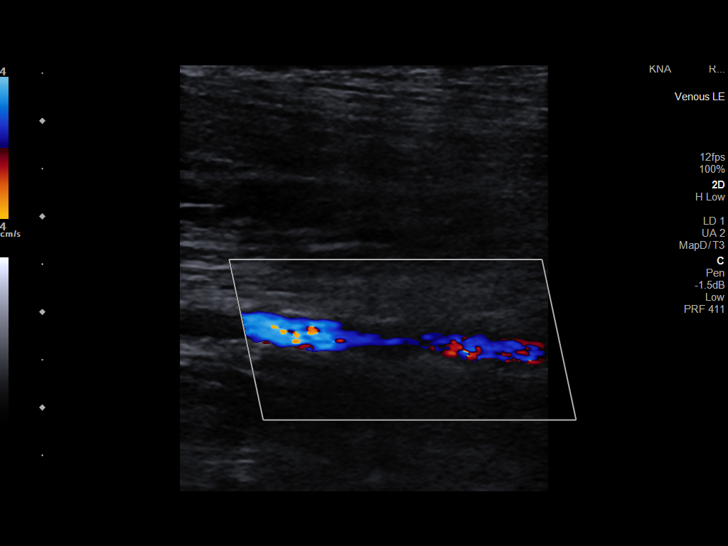

[14 of 24 positions shown; findings below may reference images not displayed]

FINDINGS: VENOUS

Normal compressibility of the common femoral, superficial femoral,
and popliteal veins, as well as the visualized calf veins.
Visualized portions of profunda femoral vein and great saphenous
vein unremarkable. No filling defects to suggest DVT on grayscale or
color Doppler imaging. Doppler waveforms show normal direction of
venous flow, normal respiratory plasticity and response to
augmentation.

Limited views of the contralateral common femoral vein are
unremarkable.

OTHER

None.

Limitations: none
IMPRESSION: Negative.

## 2023-05-06 DIAGNOSIS — M1712 Unilateral primary osteoarthritis, left knee: Secondary | ICD-10-CM | POA: Diagnosis not present

## 2023-05-07 DIAGNOSIS — I83891 Varicose veins of right lower extremities with other complications: Secondary | ICD-10-CM | POA: Diagnosis not present

## 2023-05-07 DIAGNOSIS — I83811 Varicose veins of right lower extremities with pain: Secondary | ICD-10-CM | POA: Diagnosis not present

## 2023-05-07 DIAGNOSIS — M7989 Other specified soft tissue disorders: Secondary | ICD-10-CM | POA: Diagnosis not present

## 2023-05-26 ENCOUNTER — Ambulatory Visit (HOSPITAL_COMMUNITY)
Admission: RE | Admit: 2023-05-26 | Discharge: 2023-05-26 | Disposition: A | Discharge status: Home / Self Care | Source: Ambulatory Visit | Attending: Internal Medicine | Admitting: Internal Medicine

## 2023-05-26 ENCOUNTER — Other Ambulatory Visit (HOSPITAL_COMMUNITY): Payer: Self-pay | Admitting: Internal Medicine

## 2023-05-26 DIAGNOSIS — R519 Headache, unspecified: Secondary | ICD-10-CM | POA: Diagnosis not present

## 2023-05-26 DIAGNOSIS — G43909 Migraine, unspecified, not intractable, without status migrainosus: Secondary | ICD-10-CM | POA: Diagnosis not present

## 2023-07-10 DIAGNOSIS — L820 Inflamed seborrheic keratosis: Secondary | ICD-10-CM | POA: Diagnosis not present

## 2023-07-10 DIAGNOSIS — X32XXXD Exposure to sunlight, subsequent encounter: Secondary | ICD-10-CM | POA: Diagnosis not present

## 2023-07-10 DIAGNOSIS — L570 Actinic keratosis: Secondary | ICD-10-CM | POA: Diagnosis not present

## 2023-07-15 DIAGNOSIS — R7301 Impaired fasting glucose: Secondary | ICD-10-CM | POA: Diagnosis not present

## 2023-07-15 DIAGNOSIS — E7800 Pure hypercholesterolemia, unspecified: Secondary | ICD-10-CM | POA: Diagnosis not present

## 2023-07-21 DIAGNOSIS — I8390 Asymptomatic varicose veins of unspecified lower extremity: Secondary | ICD-10-CM | POA: Diagnosis not present

## 2023-07-21 DIAGNOSIS — I803 Phlebitis and thrombophlebitis of lower extremities, unspecified: Secondary | ICD-10-CM | POA: Diagnosis not present

## 2023-07-21 DIAGNOSIS — Z0001 Encounter for general adult medical examination with abnormal findings: Secondary | ICD-10-CM | POA: Diagnosis not present

## 2023-07-21 DIAGNOSIS — R748 Abnormal levels of other serum enzymes: Secondary | ICD-10-CM | POA: Diagnosis not present

## 2023-07-21 DIAGNOSIS — J42 Unspecified chronic bronchitis: Secondary | ICD-10-CM | POA: Diagnosis not present

## 2023-07-21 DIAGNOSIS — R059 Cough, unspecified: Secondary | ICD-10-CM | POA: Diagnosis not present

## 2023-07-21 DIAGNOSIS — E7800 Pure hypercholesterolemia, unspecified: Secondary | ICD-10-CM | POA: Diagnosis not present

## 2023-07-21 DIAGNOSIS — K219 Gastro-esophageal reflux disease without esophagitis: Secondary | ICD-10-CM | POA: Diagnosis not present

## 2023-07-21 DIAGNOSIS — M179 Osteoarthritis of knee, unspecified: Secondary | ICD-10-CM | POA: Diagnosis not present

## 2023-07-30 ENCOUNTER — Ambulatory Visit (HOSPITAL_COMMUNITY)
Admission: RE | Admit: 2023-07-30 | Discharge: 2023-07-30 | Disposition: A | Discharge status: Home / Self Care | Source: Ambulatory Visit | Attending: Nurse Practitioner | Admitting: Nurse Practitioner

## 2023-07-30 ENCOUNTER — Encounter (HOSPITAL_COMMUNITY): Payer: Self-pay | Admitting: Nurse Practitioner

## 2023-07-30 ENCOUNTER — Other Ambulatory Visit (HOSPITAL_COMMUNITY): Payer: Self-pay | Admitting: Nurse Practitioner

## 2023-07-30 DIAGNOSIS — R109 Unspecified abdominal pain: Secondary | ICD-10-CM | POA: Insufficient documentation

## 2023-07-30 DIAGNOSIS — Z96641 Presence of right artificial hip joint: Secondary | ICD-10-CM | POA: Diagnosis not present

## 2023-07-30 DIAGNOSIS — M545 Low back pain, unspecified: Secondary | ICD-10-CM | POA: Diagnosis not present

## 2023-08-07 DIAGNOSIS — I83891 Varicose veins of right lower extremities with other complications: Secondary | ICD-10-CM | POA: Diagnosis not present

## 2023-08-07 DIAGNOSIS — L819 Disorder of pigmentation, unspecified: Secondary | ICD-10-CM | POA: Diagnosis not present

## 2023-08-07 DIAGNOSIS — R600 Localized edema: Secondary | ICD-10-CM | POA: Diagnosis not present

## 2023-08-07 DIAGNOSIS — I8311 Varicose veins of right lower extremity with inflammation: Secondary | ICD-10-CM | POA: Diagnosis not present

## 2023-08-07 DIAGNOSIS — G2581 Restless legs syndrome: Secondary | ICD-10-CM | POA: Diagnosis not present

## 2023-08-27 ENCOUNTER — Other Ambulatory Visit (HOSPITAL_COMMUNITY): Payer: Self-pay | Admitting: Family Medicine

## 2023-08-27 DIAGNOSIS — R059 Cough, unspecified: Secondary | ICD-10-CM | POA: Diagnosis not present

## 2023-08-27 DIAGNOSIS — G8929 Other chronic pain: Secondary | ICD-10-CM

## 2023-08-27 DIAGNOSIS — M545 Low back pain, unspecified: Secondary | ICD-10-CM | POA: Diagnosis not present

## 2023-08-30 ENCOUNTER — Ambulatory Visit (HOSPITAL_COMMUNITY)
Admission: RE | Admit: 2023-08-30 | Discharge: 2023-08-30 | Disposition: A | Discharge status: Home / Self Care | Source: Ambulatory Visit | Attending: Family Medicine | Admitting: Family Medicine

## 2023-08-30 DIAGNOSIS — M47816 Spondylosis without myelopathy or radiculopathy, lumbar region: Secondary | ICD-10-CM | POA: Diagnosis not present

## 2023-08-30 DIAGNOSIS — G8929 Other chronic pain: Secondary | ICD-10-CM | POA: Diagnosis not present

## 2023-08-30 DIAGNOSIS — M48061 Spinal stenosis, lumbar region without neurogenic claudication: Secondary | ICD-10-CM | POA: Diagnosis not present

## 2023-08-30 DIAGNOSIS — M549 Dorsalgia, unspecified: Secondary | ICD-10-CM | POA: Diagnosis not present

## 2023-08-30 DIAGNOSIS — M51369 Other intervertebral disc degeneration, lumbar region without mention of lumbar back pain or lower extremity pain: Secondary | ICD-10-CM | POA: Diagnosis not present

## 2023-08-30 DIAGNOSIS — M545 Low back pain, unspecified: Secondary | ICD-10-CM | POA: Diagnosis not present

## 2023-09-23 DIAGNOSIS — M1712 Unilateral primary osteoarthritis, left knee: Secondary | ICD-10-CM | POA: Diagnosis not present

## 2023-10-09 DIAGNOSIS — X32XXXD Exposure to sunlight, subsequent encounter: Secondary | ICD-10-CM | POA: Diagnosis not present

## 2023-10-09 DIAGNOSIS — L820 Inflamed seborrheic keratosis: Secondary | ICD-10-CM | POA: Diagnosis not present

## 2023-10-09 DIAGNOSIS — L570 Actinic keratosis: Secondary | ICD-10-CM | POA: Diagnosis not present

## 2023-11-06 DIAGNOSIS — M25552 Pain in left hip: Secondary | ICD-10-CM | POA: Diagnosis not present

## 2023-11-06 DIAGNOSIS — M545 Low back pain, unspecified: Secondary | ICD-10-CM | POA: Diagnosis not present

## 2023-12-03 DIAGNOSIS — Z23 Encounter for immunization: Secondary | ICD-10-CM | POA: Diagnosis not present

## 2023-12-09 DIAGNOSIS — G8929 Other chronic pain: Secondary | ICD-10-CM | POA: Diagnosis not present

## 2023-12-09 DIAGNOSIS — M1712 Unilateral primary osteoarthritis, left knee: Secondary | ICD-10-CM | POA: Diagnosis not present

## 2023-12-09 DIAGNOSIS — M25562 Pain in left knee: Secondary | ICD-10-CM | POA: Diagnosis not present

## 2023-12-16 DIAGNOSIS — R7301 Impaired fasting glucose: Secondary | ICD-10-CM | POA: Diagnosis not present

## 2023-12-16 DIAGNOSIS — E7800 Pure hypercholesterolemia, unspecified: Secondary | ICD-10-CM | POA: Diagnosis not present

## 2023-12-31 DIAGNOSIS — M179 Osteoarthritis of knee, unspecified: Secondary | ICD-10-CM | POA: Diagnosis not present

## 2023-12-31 DIAGNOSIS — R7301 Impaired fasting glucose: Secondary | ICD-10-CM | POA: Diagnosis not present

## 2023-12-31 DIAGNOSIS — E7800 Pure hypercholesterolemia, unspecified: Secondary | ICD-10-CM | POA: Diagnosis not present

## 2023-12-31 DIAGNOSIS — R748 Abnormal levels of other serum enzymes: Secondary | ICD-10-CM | POA: Diagnosis not present

## 2023-12-31 DIAGNOSIS — Z713 Dietary counseling and surveillance: Secondary | ICD-10-CM | POA: Diagnosis not present

## 2023-12-31 DIAGNOSIS — K219 Gastro-esophageal reflux disease without esophagitis: Secondary | ICD-10-CM | POA: Diagnosis not present

## 2023-12-31 DIAGNOSIS — I8390 Asymptomatic varicose veins of unspecified lower extremity: Secondary | ICD-10-CM | POA: Diagnosis not present

## 2024-04-01 ENCOUNTER — Other Ambulatory Visit (HOSPITAL_COMMUNITY): Payer: Self-pay | Admitting: Family Medicine

## 2024-04-01 DIAGNOSIS — R062 Wheezing: Secondary | ICD-10-CM

## 2024-04-05 ENCOUNTER — Ambulatory Visit (HOSPITAL_COMMUNITY)
Admission: RE | Admit: 2024-04-05 | Discharge: 2024-04-05 | Disposition: A | Source: Ambulatory Visit | Attending: Family Medicine | Admitting: Family Medicine

## 2024-04-05 DIAGNOSIS — R062 Wheezing: Secondary | ICD-10-CM
